# Patient Record
Sex: Female | Born: 1965 | Race: White | Hispanic: No | Smoking: Never smoker
Health system: Southern US, Community
[De-identification: ages and names within clinical notes are randomized; demographics above are authoritative.]

## PROBLEM LIST (undated history)

## (undated) DIAGNOSIS — E559 Vitamin D deficiency, unspecified: Secondary | ICD-10-CM

## (undated) DIAGNOSIS — E119 Type 2 diabetes mellitus without complications: Secondary | ICD-10-CM

## (undated) DIAGNOSIS — I10 Essential (primary) hypertension: Secondary | ICD-10-CM

---

## 2017-06-08 ENCOUNTER — Emergency Department (HOSPITAL_COMMUNITY)
Admission: EM | Admit: 2017-06-08 | Discharge: 2017-06-09 | Disposition: A | Discharge status: Home / Self Care | Payer: BLUE CROSS/BLUE SHIELD | Attending: Physician Assistant | Admitting: Physician Assistant

## 2017-06-08 ENCOUNTER — Encounter (HOSPITAL_COMMUNITY): Payer: Self-pay

## 2017-06-08 ENCOUNTER — Emergency Department (HOSPITAL_COMMUNITY): Payer: BLUE CROSS/BLUE SHIELD

## 2017-06-08 DIAGNOSIS — E119 Type 2 diabetes mellitus without complications: Secondary | ICD-10-CM | POA: Diagnosis not present

## 2017-06-08 DIAGNOSIS — M25511 Pain in right shoulder: Secondary | ICD-10-CM | POA: Insufficient documentation

## 2017-06-08 DIAGNOSIS — Z794 Long term (current) use of insulin: Secondary | ICD-10-CM | POA: Diagnosis not present

## 2017-06-08 DIAGNOSIS — K8020 Calculus of gallbladder without cholecystitis without obstruction: Secondary | ICD-10-CM

## 2017-06-08 DIAGNOSIS — R1013 Epigastric pain: Secondary | ICD-10-CM | POA: Diagnosis present

## 2017-06-08 DIAGNOSIS — R1011 Right upper quadrant pain: Secondary | ICD-10-CM

## 2017-06-08 DIAGNOSIS — I10 Essential (primary) hypertension: Secondary | ICD-10-CM | POA: Insufficient documentation

## 2017-06-08 HISTORY — DX: Vitamin D deficiency, unspecified: E55.9

## 2017-06-08 HISTORY — DX: Type 2 diabetes mellitus without complications: E11.9

## 2017-06-08 HISTORY — DX: Essential (primary) hypertension: I10

## 2017-06-08 LAB — URINALYSIS, ROUTINE W REFLEX MICROSCOPIC
BILIRUBIN URINE: NEGATIVE
Glucose, UA: NEGATIVE mg/dL
HGB URINE DIPSTICK: NEGATIVE
Ketones, ur: NEGATIVE mg/dL
Leukocytes, UA: NEGATIVE
NITRITE: NEGATIVE
PH: 5 (ref 5.0–8.0)
Protein, ur: NEGATIVE mg/dL
SPECIFIC GRAVITY, URINE: 1.017 (ref 1.005–1.030)

## 2017-06-08 LAB — COMPREHENSIVE METABOLIC PANEL
ALBUMIN: 4.1 g/dL (ref 3.5–5.0)
ALK PHOS: 84 U/L (ref 38–126)
ALT: 18 U/L (ref 14–54)
ANION GAP: 9 (ref 5–15)
AST: 22 U/L (ref 15–41)
BILIRUBIN TOTAL: 0.4 mg/dL (ref 0.3–1.2)
BUN: 18 mg/dL (ref 6–20)
CALCIUM: 9.8 mg/dL (ref 8.9–10.3)
CO2: 25 mmol/L (ref 22–32)
Chloride: 100 mmol/L — ABNORMAL LOW (ref 101–111)
Creatinine, Ser: 1.03 mg/dL — ABNORMAL HIGH (ref 0.44–1.00)
GFR calc Af Amer: >60 mL/min (ref >60)
GFR calc non Af Amer: >60 mL/min (ref >60)
GLUCOSE: 227 mg/dL — AB (ref 65–99)
Potassium: 4.3 mmol/L (ref 3.5–5.1)
Sodium: 134 mmol/L — ABNORMAL LOW (ref 135–145)
TOTAL PROTEIN: 7.2 g/dL (ref 6.5–8.1)

## 2017-06-08 LAB — CBC
HCT: 41.1 % (ref 36.0–46.0)
Hemoglobin: 14 g/dL (ref 12.0–15.0)
MCH: 29.4 pg (ref 26.0–34.0)
MCHC: 34.1 g/dL (ref 30.0–36.0)
MCV: 86.2 fL (ref 78.0–100.0)
PLATELETS: 357 10*3/uL (ref 150–400)
RBC: 4.77 MIL/uL (ref 3.87–5.11)
RDW: 12.4 % (ref 11.5–15.5)
WBC: 11.4 10*3/uL — ABNORMAL HIGH (ref 4.0–10.5)

## 2017-06-08 LAB — I-STAT TROPONIN, ED: TROPONIN I, POC: 0 ng/mL (ref 0.00–0.08)

## 2017-06-08 LAB — LIPASE, BLOOD: Lipase: 24 U/L (ref 11–51)

## 2017-06-08 MED ORDER — HYDROCODONE-ACETAMINOPHEN 5-325 MG PO TABS: 1.0000 | ORAL_TABLET | Freq: Four times a day (QID) | ORAL | 0 refills | Status: DC | PRN

## 2017-06-08 MED ORDER — SODIUM CHLORIDE 0.9 % IV BOLUS (SEPSIS)
1000.0000 mL | Freq: Once | INTRAVENOUS | Status: AC
  Administered 2017-06-08: 1000 mL via INTRAVENOUS

## 2017-06-08 MED ORDER — ONDANSETRON HCL 4 MG/2ML IJ SOLN
4.0000 mg | Freq: Once | INTRAMUSCULAR | Status: AC
  Administered 2017-06-08: 4 mg via INTRAVENOUS
  Filled 2017-06-08: qty 2

## 2017-06-08 MED ORDER — ONDANSETRON 4 MG PO TBDP: ORAL_TABLET | ORAL | 0 refills | Status: DC

## 2017-06-08 NOTE — ED Triage Notes (Signed)
Onset 2 weeks upper mid to right abd pain, tender to touch and nausea.

## 2017-06-08 NOTE — ED Notes (Signed)
Patient transported to Ultrasound 

## 2017-06-08 NOTE — Discharge Instructions (Signed)
1. Medications: zofran, vicodin, usual home medications 2. Treatment: rest, drink plenty of fluids, advance diet slowly 3. Follow Up: Please followup with your primary doctor in 2 days for discussion of your diagnoses and further evaluation after today's visit; if you do not have a primary care doctor use the resource guide provided to find one; Please return to the ER for persistent vomiting, high fevers, worsening pain or worsening symptoms

## 2017-06-08 NOTE — ED Provider Notes (Signed)
[facility] Provider Note   CSN: [id number] Arrival date & time: 06/08/17  1612     History   Chief Complaint Chief Complaint  Patient presents with  . Abdominal Pain    HPI Cynthia SparkMichelle Dykman is a 51 y.o. female with a hx of IDDM, HTN presents to the Emergency Department complaining of gradual, persistent, intermittent progressively worsening epigastric abd pain onset 2 weeks ago which became constant 1 week ago.  Pt reports the pain is sharp and burning rated at a 6/10.   Pt reports surgical hx of c-section in 1987.  Patient reports that the pain at times radiates into her right chest and right shoulder.  She denies chest pain or dyspnea on exertion.  She reports symptoms worsen after eating.  She has had associated nausea without vomiting.  Patient denies fevers or chills, vaginal discharge, dysuria.  No alleviating factors.  Also with complaints of hyperglycemia.  She reports she has been using insulin for approx 1 year, but reports her CBG has been difficult to control.  She reports her initial A1c was 12.8 at diagnosis and her blood sugar usually runs 200-500.  She has associated pain in her feet, polyuria and polydipsia.  She reports she has had some weight loss in the last several months but she has been attempting to change her diet.   The history is provided by the patient and medical records. No language interpreter was used.    Past Medical History:  Diagnosis Date  . Diabetes mellitus without complication (HCC)   . Hypertension   . Vitamin D deficiency     There are no active problems to display for this patient.   History reviewed. No pertinent surgical history.  OB History    No data available       Home Medications    Prior to Admission medications   Medication Sig Start Date End Date Taking? Authorizing Provider  HYDROcodone-acetaminophen (NORCO/VICODIN) 5-325 MG tablet Take 1-2 tablets every 6 (six) hours as needed  by mouth for moderate pain or severe pain. 06/08/17   Shavonte Zhao, Dahlia ClientHannah, PA-C  ondansetron (ZOFRAN ODT) 4 MG disintegrating tablet 4mg  ODT q4 hours prn nausea/vomit 06/08/17   Blanton Kardell, Dahlia ClientHannah, PA-C    Family History History reviewed. No pertinent family history.  Social History Social History   Tobacco Use  . Smoking status: Never Smoker  . Smokeless tobacco: Never Used  Substance Use Topics  . Alcohol use: Yes    Comment: occ   . Drug use: No     Allergies   Patient has no known allergies.   Review of Systems Review of Systems  Constitutional: Negative for appetite change, diaphoresis, fatigue, fever and unexpected weight change.  HENT: Negative for mouth sores.   Eyes: Negative for visual disturbance.  Respiratory: Negative for cough, chest tightness, shortness of breath and wheezing.   Cardiovascular: Positive for chest pain.  Gastrointestinal: Positive for abdominal pain and nausea. Negative for constipation, diarrhea and vomiting.  Endocrine: Positive for polydipsia and polyuria. Negative for polyphagia.  Genitourinary: Negative for dysuria, frequency, hematuria and urgency.  Musculoskeletal: Positive for arthralgias ( Shoulder). Negative for back pain and neck stiffness.  Skin: Negative for rash.  Allergic/Immunologic: Negative for immunocompromised state.  Neurological: Negative for syncope, light-headedness and headaches.  Hematological: Does not bruise/bleed easily.  Psychiatric/Behavioral: Negative for sleep disturbance. The patient is not nervous/anxious.   All other systems reviewed and are negative.    Physical Exam Updated Vital  Signs BP (!) 146/77   Pulse 68   Temp 98.7 F (37.1 C) (Oral)   Resp 18   Ht 5\' 3"  (1.6 m)   Wt 70.3 kg (155 lb)   SpO2 99%   BMI 27.46 kg/m   Physical Exam  Constitutional: She appears well-developed and well-nourished. No distress.  Awake, alert, nontoxic appearance  HENT:  Head: Normocephalic and  atraumatic.  Mouth/Throat: Oropharynx is clear and moist. No oropharyngeal exudate.  Eyes: Conjunctivae are normal. No scleral icterus.  Neck: Normal range of motion. Neck supple.  Cardiovascular: Normal rate, regular rhythm and intact distal pulses.  Pulmonary/Chest: Effort normal and breath sounds normal. No respiratory distress. She has no wheezes.  Equal chest expansion  Abdominal: Soft. Bowel sounds are normal. She exhibits no mass. There is no hepatomegaly. There is tenderness in the right upper quadrant. There is guarding and positive Murphy's sign. There is no rigidity, no rebound, no CVA tenderness and no tenderness at McBurney's point. No hernia.  Musculoskeletal: Normal range of motion. She exhibits no edema.  Neurological: She is alert.  Speech is clear and goal oriented Moves extremities without ataxia  Skin: Skin is warm and dry. She is not diaphoretic.  Psychiatric: She has a normal mood and affect.  Nursing note and vitals reviewed.    ED Treatments / Results  Labs (all labs ordered are listed, but only abnormal results are displayed) Labs Reviewed  COMPREHENSIVE METABOLIC PANEL - Abnormal; Notable for the following components:      Result Value   Sodium 134 (*)    Chloride 100 (*)    Glucose, Bld 227 (*)    Creatinine, Ser 1.03 (*)    All other components within normal limits  CBC - Abnormal; Notable for the following components:   WBC 11.4 (*)    All other components within normal limits  LIPASE, BLOOD  URINALYSIS, ROUTINE W REFLEX MICROSCOPIC  I-STAT TROPONIN, ED    EKG  EKG Interpretation  Date/Time:  Sunday June 08 2017 21:00:38 EST Ventricular Rate:  66 PR Interval:    QRS Duration: 94 QT Interval:  418 QTC Calculation: 438 R Axis:   78 Text Interpretation:  Sinus rhythm Consider left atrial enlargement Normal sinus rhythm Confirmed by Corlis Leak, Courteney ([id number]) on 06/08/2017 9:05:27 PM       Radiology US Abdomen Complete  Result Date:  06/08/2017 CLINICAL DATA:  51 y/o F; paraumbilical and right upper quadrant abdominal pain for 2 weeks. History of C-section, diabetes, and hypertension. EXAM: ABDOMEN ULTRASOUND COMPLETE COMPARISON:  None. FINDINGS: Gallbladder: Gallstones. No gallbladder wall thickening or pericholecystic fluid. Negative sonographic Murphy sign. Common bile duct: Diameter: 3.4 mm Liver: No focal lesion identified. Increased liver echogenicity. Portal vein is patent on color Doppler imaging with normal direction of blood flow towards the liver. IVC: No abnormality visualized. Pancreas: Visualized portion unremarkable. Spleen: 12.9 cm spleen. Right Kidney: Length: 12.1 cm. Echogenicity within normal limits. No mass or hydronephrosis visualized. Left Kidney: Length: 12.9 cm. Echogenicity within normal limits. No mass or hydronephrosis visualized. Abdominal aorta: No aneurysm visualized. Other findings: None. IMPRESSION: 1. Cholelithiasis.  No secondary signs of acute cholecystitis. 2. Hepatic steatosis. 3. Otherwise unremarkable abdominal ultrasound. Electronically Signed   By: Mitzi Hansen M.D.   On: 06/08/2017 22:57    Procedures Procedures (including critical care time)  Medications Ordered in ED Medications  sodium chloride 0.9 % bolus 1,000 mL (1,000 mLs Intravenous New Bag/Given 06/08/17 2114)  ondansetron (ZOFRAN) injection 4 mg (4  mg Intravenous Given 06/08/17 2112)     Initial Impression / Assessment and Plan / ED Course  I have reviewed the triage vital signs and the nursing notes.  Pertinent labs & imaging results that were available during my care of the patient were reviewed by me and considered in my medical decision making (see chart for details).     Right upper quadrant abdominal pain.  No emesis.  Moderate right upper quadrant tenderness on exam.  She declines pain medicine.  She has been able to eat and drink prior to her arrival in the emergency department without severe pain or  vomiting.  Repeat exam is soft.  Pt continues to climb pain control.  Discussed results and recommended follow-up with general surgery.  Also discussed reasons to return to the emergency department.  Patient states understanding and is in agreement with the plan.  Final Clinical Impressions(s) / ED Diagnoses   Final diagnoses:  Right upper quadrant abdominal pain  Calculus of gallbladder without cholecystitis without obstruction    ED Discharge Orders        Ordered    ondansetron (ZOFRAN ODT) 4 MG disintegrating tablet     06/08/17 2314    HYDROcodone-acetaminophen (NORCO/VICODIN) 5-325 MG tablet  Every 6 hours PRN     06/08/17 2314       Marlyn Rabine, Boyd KerbsHannah, PA-C 06/08/17 2353    Mackuen, Cindee Saltourteney Lyn, MD 06/12/17 2313

## 2017-06-08 NOTE — ED Notes (Signed)
ED Provider at bedside. 

## 2019-11-10 NOTE — Unmapped External Note (Signed)
 30 Day Event Monitor applied per Leotis Sella, NP for palpitations.   Electronically signed by: Nathanel VEAR Fraction, CMA 11/10/19 1333

## 2020-06-12 ENCOUNTER — Inpatient Hospital Stay (HOSPITAL_COMMUNITY)
Admission: AD | Admit: 2020-06-12 | Discharge: 2020-06-20 | DRG: 234 | Disposition: A | Discharge status: Home Health | Payer: BC Managed Care – PPO | Source: Ambulatory Visit | Attending: Cardiothoracic Surgery | Admitting: Cardiothoracic Surgery

## 2020-06-12 DIAGNOSIS — I25110 Atherosclerotic heart disease of native coronary artery with unstable angina pectoris: Principal | ICD-10-CM | POA: Diagnosis present

## 2020-06-12 DIAGNOSIS — I249 Acute ischemic heart disease, unspecified: Secondary | ICD-10-CM | POA: Diagnosis present

## 2020-06-12 DIAGNOSIS — I2510 Atherosclerotic heart disease of native coronary artery without angina pectoris: Secondary | ICD-10-CM

## 2020-06-12 DIAGNOSIS — I10 Essential (primary) hypertension: Secondary | ICD-10-CM | POA: Diagnosis present

## 2020-06-12 DIAGNOSIS — Z955 Presence of coronary angioplasty implant and graft: Secondary | ICD-10-CM

## 2020-06-12 DIAGNOSIS — R072 Precordial pain: Secondary | ICD-10-CM | POA: Diagnosis present

## 2020-06-12 DIAGNOSIS — J9811 Atelectasis: Secondary | ICD-10-CM | POA: Diagnosis not present

## 2020-06-12 DIAGNOSIS — Z9889 Other specified postprocedural states: Secondary | ICD-10-CM

## 2020-06-12 DIAGNOSIS — I6523 Occlusion and stenosis of bilateral carotid arteries: Secondary | ICD-10-CM | POA: Diagnosis present

## 2020-06-12 DIAGNOSIS — E785 Hyperlipidemia, unspecified: Secondary | ICD-10-CM | POA: Diagnosis present

## 2020-06-12 DIAGNOSIS — J939 Pneumothorax, unspecified: Secondary | ICD-10-CM

## 2020-06-12 DIAGNOSIS — Z951 Presence of aortocoronary bypass graft: Secondary | ICD-10-CM

## 2020-06-12 DIAGNOSIS — E1165 Type 2 diabetes mellitus with hyperglycemia: Secondary | ICD-10-CM | POA: Diagnosis present

## 2020-06-12 DIAGNOSIS — D62 Acute posthemorrhagic anemia: Secondary | ICD-10-CM | POA: Diagnosis not present

## 2020-06-12 DIAGNOSIS — Z20822 Contact with and (suspected) exposure to covid-19: Secondary | ICD-10-CM | POA: Diagnosis present

## 2020-06-12 LAB — CBC WITH DIFFERENTIAL/PLATELET
Abs Immature Granulocytes: 0.02 10*3/uL (ref 0.00–0.07)
Basophils Absolute: 0.1 10*3/uL (ref 0.0–0.1)
Basophils Relative: 1 %
Eosinophils Absolute: 0 10*3/uL (ref 0.0–0.5)
Eosinophils Relative: 1 %
HCT: 37.8 % (ref 36.0–46.0)
Hemoglobin: 12.9 g/dL (ref 12.0–15.0)
Immature Granulocytes: 0 %
Lymphocytes Relative: 32 %
Lymphs Abs: 2.6 10*3/uL (ref 0.7–4.0)
MCH: 29.7 pg (ref 26.0–34.0)
MCHC: 34.1 g/dL (ref 30.0–36.0)
MCV: 87.1 fL (ref 80.0–100.0)
Monocytes Absolute: 0.4 10*3/uL (ref 0.1–1.0)
Monocytes Relative: 5 %
Neutro Abs: 5.1 10*3/uL (ref 1.7–7.7)
Neutrophils Relative %: 61 %
Platelets: 313 10*3/uL (ref 150–400)
RBC: 4.34 MIL/uL (ref 3.87–5.11)
RDW: 13.1 % (ref 11.5–15.5)
WBC: 8.2 10*3/uL (ref 4.0–10.5)
nRBC: 0 % (ref 0.0–0.2)

## 2020-06-12 LAB — COMPREHENSIVE METABOLIC PANEL
ALT: 32 U/L (ref 0–44)
AST: 30 U/L (ref 15–41)
Albumin: 3.8 g/dL (ref 3.5–5.0)
Alkaline Phosphatase: 61 U/L (ref 38–126)
Anion gap: 14 (ref 5–15)
BUN: 14 mg/dL (ref 6–20)
CO2: 24 mmol/L (ref 22–32)
Calcium: 9.5 mg/dL (ref 8.9–10.3)
Chloride: 100 mmol/L (ref 98–111)
Creatinine, Ser: 1 mg/dL (ref 0.44–1.00)
GFR, Estimated: >60 mL/min (ref >60)
Glucose, Bld: 286 mg/dL — ABNORMAL HIGH (ref 70–99)
Potassium: 3.8 mmol/L (ref 3.5–5.1)
Sodium: 138 mmol/L (ref 135–145)
Total Bilirubin: 0.9 mg/dL (ref 0.3–1.2)
Total Protein: 6.7 g/dL (ref 6.5–8.1)

## 2020-06-12 LAB — PROTIME-INR
INR: 1 (ref 0.8–1.2)
Prothrombin Time: 12.9 seconds (ref 11.4–15.2)

## 2020-06-12 LAB — HEMOGLOBIN A1C
Hgb A1c MFr Bld: 9.5 % — ABNORMAL HIGH (ref 4.8–5.6)
Mean Plasma Glucose: 225.95 mg/dL

## 2020-06-12 LAB — HEPARIN LEVEL (UNFRACTIONATED): Heparin Unfractionated: 0.52 IU/mL (ref 0.30–0.70)

## 2020-06-12 LAB — HIV ANTIBODY (ROUTINE TESTING W REFLEX): HIV Screen 4th Generation wRfx: NONREACTIVE

## 2020-06-12 LAB — RESPIRATORY PANEL BY RT PCR (FLU A&B, COVID)
Influenza A by PCR: NEGATIVE
Influenza B by PCR: NEGATIVE
SARS Coronavirus 2 by RT PCR: NEGATIVE

## 2020-06-12 LAB — GLUCOSE, CAPILLARY
Glucose-Capillary: 175 mg/dL — ABNORMAL HIGH (ref 70–99)
Glucose-Capillary: 126 mg/dL — ABNORMAL HIGH (ref 70–99)

## 2020-06-12 LAB — APTT: aPTT: 27 seconds (ref 24–36)

## 2020-06-12 LAB — TROPONIN I (HIGH SENSITIVITY)
Troponin I (High Sensitivity): 18 ng/L — ABNORMAL HIGH (ref <18)
Troponin I (High Sensitivity): 20 ng/L — ABNORMAL HIGH (ref <18)

## 2020-06-12 MED ORDER — INSULIN GLARGINE 100 UNIT/ML SOLOSTAR PEN: 20.0000 [IU] | PEN_INJECTOR | SUBCUTANEOUS | Status: DC

## 2020-06-12 MED ORDER — ACETAMINOPHEN 325 MG PO TABS
650.0000 mg | ORAL_TABLET | ORAL | Status: DC | PRN
  Administered 2020-06-12: 650 mg via ORAL
  Filled 2020-06-12: qty 2

## 2020-06-12 MED ORDER — SODIUM CHLORIDE 0.9% FLUSH: 3.0000 mL | INTRAVENOUS | Status: DC | PRN

## 2020-06-12 MED ORDER — SODIUM CHLORIDE 0.9 % IV SOLN: INTRAVENOUS | Status: DC

## 2020-06-12 MED ORDER — INSULIN ASPART 100 UNIT/ML ~~LOC~~ SOLN
4.0000 [IU] | Freq: Three times a day (TID) | SUBCUTANEOUS | Status: DC
  Administered 2020-06-13 (×2): 4 [IU] via SUBCUTANEOUS

## 2020-06-12 MED ORDER — INSULIN GLARGINE 100 UNIT/ML ~~LOC~~ SOLN
20.0000 [IU] | Freq: Two times a day (BID) | SUBCUTANEOUS | Status: DC
  Administered 2020-06-12 – 2020-06-13 (×2): 20 [IU] via SUBCUTANEOUS
  Filled 2020-06-12 (×4): qty 0.2

## 2020-06-12 MED ORDER — ASPIRIN EC 81 MG PO TBEC
81.0000 mg | DELAYED_RELEASE_TABLET | Freq: Every day | ORAL | Status: DC
  Administered 2020-06-13: 81 mg via ORAL
  Filled 2020-06-12: qty 1

## 2020-06-12 MED ORDER — ONDANSETRON HCL 4 MG/2ML IJ SOLN: 4.0000 mg | Freq: Four times a day (QID) | INTRAMUSCULAR | Status: DC | PRN

## 2020-06-12 MED ORDER — METOPROLOL SUCCINATE ER 25 MG PO TB24
25.0000 mg | ORAL_TABLET | Freq: Every day | ORAL | Status: DC
  Administered 2020-06-12: 25 mg via ORAL
  Filled 2020-06-12 (×2): qty 1

## 2020-06-12 MED ORDER — HEPARIN BOLUS VIA INFUSION
4000.0000 [IU] | Freq: Once | INTRAVENOUS | Status: AC
  Administered 2020-06-12: 4000 [IU] via INTRAVENOUS
  Filled 2020-06-12: qty 4000

## 2020-06-12 MED ORDER — ROSUVASTATIN CALCIUM 20 MG PO TABS
20.0000 mg | ORAL_TABLET | Freq: Every day | ORAL | Status: DC
  Administered 2020-06-12 – 2020-06-20 (×8): 20 mg via ORAL
  Filled 2020-06-12 (×8): qty 1

## 2020-06-12 MED ORDER — NITROGLYCERIN 0.4 MG SL SUBL: 0.4000 mg | SUBLINGUAL_TABLET | SUBLINGUAL | Status: DC | PRN

## 2020-06-12 MED ORDER — LISINOPRIL 10 MG PO TABS
10.0000 mg | ORAL_TABLET | Freq: Every day | ORAL | Status: DC
  Administered 2020-06-12 – 2020-06-13 (×2): 10 mg via ORAL
  Filled 2020-06-12 (×2): qty 1

## 2020-06-12 MED ORDER — SODIUM CHLORIDE 0.9 % IV SOLN: 250.0000 mL | INTRAVENOUS | Status: DC | PRN

## 2020-06-12 MED ORDER — HEPARIN (PORCINE) 25000 UT/250ML-% IV SOLN
1000.0000 [IU]/h | INTRAVENOUS | Status: DC
  Administered 2020-06-12: 1000 [IU]/h via INTRAVENOUS
  Filled 2020-06-12: qty 250

## 2020-06-12 MED ORDER — INSULIN ASPART 100 UNIT/ML ~~LOC~~ SOLN
0.0000 [IU] | Freq: Three times a day (TID) | SUBCUTANEOUS | Status: DC
  Administered 2020-06-13: 2 [IU] via SUBCUTANEOUS

## 2020-06-12 MED ORDER — SODIUM CHLORIDE 0.9% FLUSH
3.0000 mL | Freq: Two times a day (BID) | INTRAVENOUS | Status: DC
  Administered 2020-06-12: 3 mL via INTRAVENOUS

## 2020-06-12 NOTE — Progress Notes (Signed)
ANTICOAGULATION CONSULT NOTE - Initial Consult  Pharmacy Consult for Heparin Indication: chest pain/ACS  No Known Allergies  Patient Measurements: Height: 5\' 3"  (160 cm) Weight: 75.8 kg (167 lb) IBW/kg (Calculated) : 52.4 Heparin Dosing Weight:  68.6 kg  Vital Signs: Temp: 98.2 F (36.8 C) (11/22 1422) Temp Source: Oral (11/22 1422) BP: 122/72 (11/22 1422) Pulse Rate: 73 (11/22 1422)  Labs: No results for input(s): HGB, HCT, PLT, APTT, LABPROT, INR, HEPARINUNFRC, HEPRLOWMOCWT, CREATININE, CKTOTAL, CKMB, TROPONINIHS in the last 72 hours.  CrCl cannot be calculated (Patient's most recent lab result is older than the maximum 21 days allowed.).   Medical History: Past Medical History:  Diagnosis Date  . Diabetes mellitus without complication (HCC)   . Hypertension   . Vitamin D deficiency     Assessment: CC/HPI: CP  PMH: Known CAD with stent. HTN, HLD, DM, Vit D def.   Anticoag: Start IV heparin for CP. No PTA anticoagulation noted on pharmacy refill history.   Goal of Therapy:  Heparin level 0.3-0.7 units/ml Monitor platelets by anticoagulation protocol: Yes   Plan:  Baseline labs to be drawn. Heparin 4000 unit bolus Heparin infusion at 1000 units/hr Check heparin level in 6-8 hrs. Daily HL and CBC   Zlatan Hornback S. 01-28-2005, PharmD, BCPS Clinical Staff Pharmacist [email or web address] Merilynn Finland, Merilynn Finland 06/12/2020,2:53 PM

## 2020-06-12 NOTE — Progress Notes (Signed)
ANTICOAGULATION CONSULT NOTE   Pharmacy Consult for Heparin Indication: chest pain/ACS  Allergies  Allergen Reactions  . Darvon [Propoxyphene] Nausea And Vomiting and Other (See Comments)    Darvocet- "years ago"- "Made me VERY sick."  . Tape Rash and Other (See Comments)    "Tapes and Band-Aids blister my skin"    Patient Measurements: Height: 5\' 3"  (160 cm) Weight: 75.8 kg (167 lb) IBW/kg (Calculated) : 52.4 Heparin Dosing Weight:  68.6 kg  Vital Signs: Temp: 98.1 F (36.7 C) (11/22 2145) Temp Source: Oral (11/22 2145) BP: 124/73 (11/22 2145) Pulse Rate: 58 (11/22 2145)  Labs: Recent Labs    06/12/20 1456 06/12/20 1725 06/12/20 2245  HGB 12.9  --   --   HCT 37.8  --   --   PLT 313  --   --   APTT 27  --   --   LABPROT 12.9  --   --   INR 1.0  --   --   HEPARINUNFRC  --   --  0.52  CREATININE 1.00  --   --   TROPONINIHS 18* 20*  --     Estimated Creatinine Clearance: 61.3 mL/min (by C-G formula based on SCr of 1 mg/dL).   Medical History: Past Medical History:  Diagnosis Date  . Diabetes mellitus without complication (HCC)   . Hypertension   . Vitamin D deficiency     Assessment: CC/HPI: CP  PMH: Known CAD with stent. HTN, HLD, DM, Vit D def.   Anticoag: Start IV heparin for CP. No PTA anticoagulation noted on pharmacy refill history.  11/22 PM update:  Initial heparin level therapeutic  CBC good  Goal of Therapy:  Heparin level 0.3-0.7 units/ml Monitor platelets by anticoagulation protocol: Yes   Plan:  Cont heparin 1000 units/hr Confirmatory heparin level with AM labs  12/22, PharmD, BCPS Clinical Pharmacist Phone: [phone]

## 2020-06-12 NOTE — H&P (Signed)
Referring Physician: Richardo Hanks, MD  Cynthia Hale is an 54 y.o. female.                       Chief Complaint: Chest pain  HPI: 53 years old white female with known h/o CAD with LCx stent about 1.5 years ago, hypertension, hyperlipidemia, type 2 DM and hyperglycemia had chest pain, retrosternal, radiating to back and relieved by SL NTG. She had TMST for pre-op evaluation. She had submaximal stress test and had 2 mm ST depression in inferior and lateral leads with shortness of breath. She has hyperglycemia.  Past Medical History:  Diagnosis Date  . Diabetes mellitus without complication (HCC)   . Hypertension   . Vitamin D deficiency       No past surgical history on file.  No family history on file. Social History:  reports that she has never smoked. She has never used smokeless tobacco. She reports current alcohol use. She reports that she does not use drugs.  Allergies: No Known Allergies  Medications Prior to Admission  Medication Sig Dispense Refill  . HYDROcodone-acetaminophen (NORCO/VICODIN) 5-325 MG tablet Take 1-2 tablets every 6 (six) hours as needed by mouth for moderate pain or severe pain. 7 tablet 0  . ondansetron (ZOFRAN ODT) 4 MG disintegrating tablet 4mg  ODT q4 hours prn nausea/vomit 4 tablet 0    No results found for this or any previous visit (from the past 48 hour(s)). No results found.  Review Of Systems Constitutional: No fever, chills, weight loss or gain. Eyes: No vision change, wears glasses. No discharge or pain. Ears: No hearing loss, No tinnitus. Respiratory: No asthma, COPD, pneumonias. Positive shortness of breath. No hemoptysis. Cardiovascular: Positive chest pain, palpitation, no leg edema. Gastrointestinal: No nausea, vomiting, diarrhea, constipation. No GI bleed. No hepatitis. Genitourinary: No dysuria, hematuria, kidney stone. No incontinance. Neurological: No headache, stroke, seizures.  Psychiatry: No psych facility admission for anxiety,  depression, suicide. No detox. Skin: No rash. Musculoskeletal: Positive joint pain, fibromyalgia. No neck pain, back pain. Lymphadenopathy: No lymphadenopathy. Hematology: No anemia or easy bruising.   Blood pressure 122/72, pulse 73, temperature 98.2 F (36.8 C), temperature source Oral, resp. rate 16, height 5\' 3"  (1.6 m), weight 75.8 kg, SpO2 100 %. Body mass index is 29.58 kg/m. General appearance: alert, cooperative, appears stated age and no distress Head: Normocephalic, atraumatic. Eyes: Blue eyes, pink conjunctiva, corneas clear. PERRL, EOM's intact. Neck: No adenopathy, no carotid bruit, no JVD, supple, symmetrical, trachea midline and thyroid not enlarged. Resp: Clear to auscultation bilaterally. Cardio: Regular rate and rhythm, S1, S2 normal, II/VI systolic murmur, no click, rub or gallop GI: Soft, non-tender; bowel sounds normal; no organomegaly. Extremities: No edema, cyanosis or clubbing. Skin: Warm and dry.  Neurologic: Alert and oriented X 3, normal strength. Normal coordination and gait.  Assessment/Plan Acute coronary syndrome Hypertension Type 2 DM Hyperlipidemia S/P LCx stent ( 08/2018 )  Place in observation. IV fluids. IV heparin. Home medications. Cardiac cath in AM.  Time spent: Review of old records, Lab, x-rays, EKG, other cardiac tests, examination, discussion with patient over 70 minutes.  , MD  06/12/2020, 2:43 PM

## 2020-06-13 ENCOUNTER — Other Ambulatory Visit: Payer: Self-pay | Admitting: *Deleted

## 2020-06-13 ENCOUNTER — Encounter (HOSPITAL_COMMUNITY): Payer: Self-pay | Admitting: Cardiovascular Disease

## 2020-06-13 ENCOUNTER — Inpatient Hospital Stay (HOSPITAL_COMMUNITY): Payer: BC Managed Care – PPO

## 2020-06-13 ENCOUNTER — Encounter (HOSPITAL_COMMUNITY)
Admission: AD | Disposition: A | Discharge status: Home Health | Payer: Self-pay | Source: Ambulatory Visit | Attending: Cardiothoracic Surgery

## 2020-06-13 ENCOUNTER — Observation Stay (HOSPITAL_COMMUNITY): Payer: BC Managed Care – PPO

## 2020-06-13 DIAGNOSIS — Z20822 Contact with and (suspected) exposure to covid-19: Secondary | ICD-10-CM | POA: Diagnosis present

## 2020-06-13 DIAGNOSIS — I10 Essential (primary) hypertension: Secondary | ICD-10-CM | POA: Diagnosis present

## 2020-06-13 DIAGNOSIS — J9811 Atelectasis: Secondary | ICD-10-CM | POA: Diagnosis not present

## 2020-06-13 DIAGNOSIS — Z01810 Encounter for preprocedural cardiovascular examination: Secondary | ICD-10-CM | POA: Diagnosis not present

## 2020-06-13 DIAGNOSIS — D62 Acute posthemorrhagic anemia: Secondary | ICD-10-CM | POA: Diagnosis not present

## 2020-06-13 DIAGNOSIS — I25110 Atherosclerotic heart disease of native coronary artery with unstable angina pectoris: Secondary | ICD-10-CM | POA: Diagnosis present

## 2020-06-13 DIAGNOSIS — E1165 Type 2 diabetes mellitus with hyperglycemia: Secondary | ICD-10-CM | POA: Diagnosis present

## 2020-06-13 DIAGNOSIS — E785 Hyperlipidemia, unspecified: Secondary | ICD-10-CM | POA: Diagnosis present

## 2020-06-13 DIAGNOSIS — Z955 Presence of coronary angioplasty implant and graft: Secondary | ICD-10-CM | POA: Diagnosis not present

## 2020-06-13 DIAGNOSIS — I6523 Occlusion and stenosis of bilateral carotid arteries: Secondary | ICD-10-CM | POA: Diagnosis present

## 2020-06-13 DIAGNOSIS — I2510 Atherosclerotic heart disease of native coronary artery without angina pectoris: Secondary | ICD-10-CM | POA: Diagnosis not present

## 2020-06-13 DIAGNOSIS — I6521 Occlusion and stenosis of right carotid artery: Secondary | ICD-10-CM

## 2020-06-13 HISTORY — PX: LEFT HEART CATH AND CORONARY ANGIOGRAPHY: CATH118249

## 2020-06-13 LAB — URINALYSIS, ROUTINE W REFLEX MICROSCOPIC
Bilirubin Urine: NEGATIVE
Glucose, UA: NEGATIVE mg/dL
Hgb urine dipstick: NEGATIVE
Ketones, ur: NEGATIVE mg/dL
Leukocytes,Ua: NEGATIVE
Nitrite: NEGATIVE
Protein, ur: NEGATIVE mg/dL
Specific Gravity, Urine: 1.004 — ABNORMAL LOW (ref 1.005–1.030)
pH: 6 (ref 5.0–8.0)

## 2020-06-13 LAB — PULMONARY FUNCTION TEST
FEF 25-75 Pre: 1.88 L/sec
FEF2575-%Pred-Pre: 76 %
FEV1-%Pred-Pre: 87 %
FEV1-Pre: 2.26 L
FEV1FVC-%Pred-Pre: 98 %
FEV6-%Pred-Pre: 91 %
FEV6-Pre: 2.91 L
FEV6FVC-%Pred-Pre: 103 %
FVC-%Pred-Pre: 88 %
FVC-Pre: 2.91 L
Pre FEV1/FVC ratio: 77 %
Pre FEV6/FVC Ratio: 100 %

## 2020-06-13 LAB — BLOOD GAS, ARTERIAL
Acid-Base Excess: 0 mmol/L (ref 0.0–2.0)
Bicarbonate: 23.5 mmol/L (ref 20.0–28.0)
Drawn by: 59156
FIO2: 21
O2 Saturation: 98.4 %
Patient temperature: 36.6
pCO2 arterial: 33.7 mmHg (ref 32.0–48.0)
pH, Arterial: 7.457 — ABNORMAL HIGH (ref 7.350–7.450)
pO2, Arterial: 104 mmHg (ref 83.0–108.0)

## 2020-06-13 LAB — BASIC METABOLIC PANEL
Anion gap: 11 (ref 5–15)
BUN: 14 mg/dL (ref 6–20)
CO2: 25 mmol/L (ref 22–32)
Calcium: 9.1 mg/dL (ref 8.9–10.3)
Chloride: 102 mmol/L (ref 98–111)
Creatinine, Ser: 0.94 mg/dL (ref 0.44–1.00)
GFR, Estimated: >60 mL/min (ref >60)
Glucose, Bld: 150 mg/dL — ABNORMAL HIGH (ref 70–99)
Potassium: 4 mmol/L (ref 3.5–5.1)
Sodium: 138 mmol/L (ref 135–145)

## 2020-06-13 LAB — GLUCOSE, CAPILLARY
Glucose-Capillary: 146 mg/dL — ABNORMAL HIGH (ref 70–99)
Glucose-Capillary: 127 mg/dL — ABNORMAL HIGH (ref 70–99)
Glucose-Capillary: 103 mg/dL — ABNORMAL HIGH (ref 70–99)
Glucose-Capillary: 131 mg/dL — ABNORMAL HIGH (ref 70–99)

## 2020-06-13 LAB — ECHOCARDIOGRAM COMPLETE
Area-P 1/2: 3.65 cm2
Height: 63 in
S' Lateral: 2.4 cm
Weight: 2656.1 oz

## 2020-06-13 LAB — CBC
HCT: 37.9 % (ref 36.0–46.0)
Hemoglobin: 12.6 g/dL (ref 12.0–15.0)
MCH: 29.6 pg (ref 26.0–34.0)
MCHC: 33.2 g/dL (ref 30.0–36.0)
MCV: 89.2 fL (ref 80.0–100.0)
Platelets: 310 10*3/uL (ref 150–400)
RBC: 4.25 MIL/uL (ref 3.87–5.11)
RDW: 13.1 % (ref 11.5–15.5)
WBC: 8.7 10*3/uL (ref 4.0–10.5)
nRBC: 0 % (ref 0.0–0.2)

## 2020-06-13 LAB — TYPE AND SCREEN
ABO/RH(D): B POS
Antibody Screen: NEGATIVE

## 2020-06-13 LAB — LIPID PANEL
Cholesterol: 187 mg/dL (ref 0–200)
HDL: 38 mg/dL — ABNORMAL LOW (ref >40)
LDL Cholesterol: 105 mg/dL — ABNORMAL HIGH (ref 0–99)
Total CHOL/HDL Ratio: 4.9 RATIO
Triglycerides: 218 mg/dL — ABNORMAL HIGH (ref <150)
VLDL: 44 mg/dL — ABNORMAL HIGH (ref 0–40)

## 2020-06-13 LAB — SURGICAL PCR SCREEN
MRSA, PCR: NEGATIVE
Staphylococcus aureus: NEGATIVE

## 2020-06-13 LAB — ABO/RH: ABO/RH(D): B POS

## 2020-06-13 LAB — HEPARIN LEVEL (UNFRACTIONATED): Heparin Unfractionated: 0.38 IU/mL (ref 0.30–0.70)

## 2020-06-13 SURGERY — LEFT HEART CATH AND CORONARY ANGIOGRAPHY
Anesthesia: LOCAL

## 2020-06-13 MED ORDER — METOPROLOL TARTRATE 12.5 MG HALF TABLET
12.5000 mg | ORAL_TABLET | Freq: Once | ORAL | Status: AC
  Administered 2020-06-14: 12.5 mg via ORAL
  Filled 2020-06-13: qty 1

## 2020-06-13 MED ORDER — HEPARIN (PORCINE) 25000 UT/250ML-% IV SOLN
1000.0000 [IU]/h | INTRAVENOUS | Status: DC
  Administered 2020-06-13: 1000 [IU]/h via INTRAVENOUS
  Filled 2020-06-13: qty 250

## 2020-06-13 MED ORDER — SODIUM CHLORIDE 0.9 % IV SOLN
750.0000 mg | INTRAVENOUS | Status: AC
  Administered 2020-06-14: 750 mg via INTRAVENOUS
  Filled 2020-06-13: qty 750

## 2020-06-13 MED ORDER — SODIUM CHLORIDE 0.9 % IV SOLN
INTRAVENOUS | Status: DC
  Filled 2020-06-13: qty 30

## 2020-06-13 MED ORDER — MIDAZOLAM HCL 2 MG/2ML IJ SOLN
INTRAMUSCULAR | Status: AC
  Filled 2020-06-13: qty 2

## 2020-06-13 MED ORDER — TEMAZEPAM 15 MG PO CAPS
15.0000 mg | ORAL_CAPSULE | Freq: Once | ORAL | Status: AC | PRN
  Administered 2020-06-13: 15 mg via ORAL
  Filled 2020-06-13: qty 1

## 2020-06-13 MED ORDER — IOHEXOL 350 MG/ML SOLN
INTRAVENOUS | Status: DC | PRN
  Administered 2020-06-13: 70 mL

## 2020-06-13 MED ORDER — HYDRALAZINE HCL 20 MG/ML IJ SOLN: 10.0000 mg | INTRAMUSCULAR | Status: AC | PRN

## 2020-06-13 MED ORDER — BISACODYL 5 MG PO TBEC
5.0000 mg | DELAYED_RELEASE_TABLET | Freq: Once | ORAL | Status: AC
  Administered 2020-06-13: 5 mg via ORAL
  Filled 2020-06-13: qty 1

## 2020-06-13 MED ORDER — VANCOMYCIN HCL 1250 MG/250ML IV SOLN
1250.0000 mg | INTRAVENOUS | Status: AC
  Administered 2020-06-14: 1250 mg via INTRAVENOUS
  Filled 2020-06-13: qty 250

## 2020-06-13 MED ORDER — LABETALOL HCL 5 MG/ML IV SOLN: 10.0000 mg | INTRAVENOUS | Status: AC | PRN

## 2020-06-13 MED ORDER — TRANEXAMIC ACID (OHS) PUMP PRIME SOLUTION
2.0000 mg/kg | INTRAVENOUS | Status: DC
  Filled 2020-06-13: qty 1.51

## 2020-06-13 MED ORDER — SODIUM CHLORIDE 0.9% FLUSH: 3.0000 mL | Freq: Two times a day (BID) | INTRAVENOUS | Status: DC

## 2020-06-13 MED ORDER — LIDOCAINE HCL (PF) 1 % IJ SOLN
INTRAMUSCULAR | Status: AC
  Filled 2020-06-13: qty 30

## 2020-06-13 MED ORDER — PHENYLEPHRINE HCL-NACL 20-0.9 MG/250ML-% IV SOLN
30.0000 ug/min | INTRAVENOUS | Status: DC
  Administered 2020-06-14: 40 ug/min via INTRAVENOUS
  Filled 2020-06-13: qty 250

## 2020-06-13 MED ORDER — EPINEPHRINE HCL 5 MG/250ML IV SOLN IN NS
0.0000 ug/min | INTRAVENOUS | Status: DC
  Filled 2020-06-13: qty 250

## 2020-06-13 MED ORDER — FENTANYL CITRATE (PF) 100 MCG/2ML IJ SOLN
INTRAMUSCULAR | Status: DC | PRN
  Administered 2020-06-13: 25 ug via INTRAVENOUS

## 2020-06-13 MED ORDER — POTASSIUM CHLORIDE 2 MEQ/ML IV SOLN
80.0000 meq | INTRAVENOUS | Status: DC
  Filled 2020-06-13: qty 40

## 2020-06-13 MED ORDER — NOREPINEPHRINE 4 MG/250ML-% IV SOLN
0.0000 ug/min | INTRAVENOUS | Status: DC
  Filled 2020-06-13: qty 250

## 2020-06-13 MED ORDER — SODIUM CHLORIDE 0.9% FLUSH: 3.0000 mL | INTRAVENOUS | Status: DC | PRN

## 2020-06-13 MED ORDER — INSULIN REGULAR(HUMAN) IN NACL 100-0.9 UT/100ML-% IV SOLN
INTRAVENOUS | Status: AC
  Administered 2020-06-14: 4.4 [IU]/h via INTRAVENOUS
  Filled 2020-06-13: qty 100

## 2020-06-13 MED ORDER — SODIUM CHLORIDE 0.9 % IV SOLN
1.5000 g | INTRAVENOUS | Status: AC
  Administered 2020-06-14: 1.5 g via INTRAVENOUS
  Filled 2020-06-13: qty 1.5

## 2020-06-13 MED ORDER — CHLORHEXIDINE GLUCONATE 0.12 % MT SOLN
15.0000 mL | Freq: Once | OROMUCOSAL | Status: AC
  Administered 2020-06-14: 15 mL via OROMUCOSAL
  Filled 2020-06-13: qty 15

## 2020-06-13 MED ORDER — LIDOCAINE HCL (PF) 1 % IJ SOLN
INTRAMUSCULAR | Status: DC | PRN
  Administered 2020-06-13: 20 mL via INTRADERMAL

## 2020-06-13 MED ORDER — HEPARIN (PORCINE) IN NACL 1000-0.9 UT/500ML-% IV SOLN
INTRAVENOUS | Status: DC | PRN
  Administered 2020-06-13 (×2): 500 mL

## 2020-06-13 MED ORDER — TRANEXAMIC ACID (OHS) BOLUS VIA INFUSION
15.0000 mg/kg | INTRAVENOUS | Status: DC
  Administered 2020-06-14: 1129.5 mg via INTRAVENOUS
  Filled 2020-06-13: qty 1130

## 2020-06-13 MED ORDER — TRANEXAMIC ACID 1000 MG/10ML IV SOLN
1.5000 mg/kg/h | INTRAVENOUS | Status: DC
  Administered 2020-06-14: 1.5 mg/kg/h via INTRAVENOUS
  Filled 2020-06-13: qty 25

## 2020-06-13 MED ORDER — MAGNESIUM SULFATE 50 % IJ SOLN
40.0000 meq | INTRAMUSCULAR | Status: DC
  Filled 2020-06-13: qty 9.85

## 2020-06-13 MED ORDER — PLASMA-LYTE 148 IV SOLN
INTRAVENOUS | Status: DC
  Filled 2020-06-13: qty 2.5

## 2020-06-13 MED ORDER — MILRINONE LACTATE IN DEXTROSE 20-5 MG/100ML-% IV SOLN
0.3000 ug/kg/min | INTRAVENOUS | Status: DC
  Filled 2020-06-13: qty 100

## 2020-06-13 MED ORDER — DEXMEDETOMIDINE HCL IN NACL 400 MCG/100ML IV SOLN
0.1000 ug/kg/h | INTRAVENOUS | Status: DC
  Administered 2020-06-14: .7 ug/kg/h via INTRAVENOUS
  Filled 2020-06-13: qty 100

## 2020-06-13 MED ORDER — SODIUM CHLORIDE 0.9 % IV SOLN: INTRAVENOUS | Status: AC

## 2020-06-13 MED ORDER — MIDAZOLAM HCL 2 MG/2ML IJ SOLN
INTRAMUSCULAR | Status: DC | PRN
  Administered 2020-06-13: 1 mg via INTRAVENOUS

## 2020-06-13 MED ORDER — FENTANYL CITRATE (PF) 100 MCG/2ML IJ SOLN
INTRAMUSCULAR | Status: AC
  Filled 2020-06-13: qty 2

## 2020-06-13 MED ORDER — HEPARIN (PORCINE) IN NACL 1000-0.9 UT/500ML-% IV SOLN
INTRAVENOUS | Status: AC
  Filled 2020-06-13: qty 1000

## 2020-06-13 MED ORDER — SODIUM CHLORIDE 0.9 % IV SOLN: 250.0000 mL | INTRAVENOUS | Status: DC | PRN

## 2020-06-13 MED ORDER — NITROGLYCERIN IN D5W 200-5 MCG/ML-% IV SOLN
2.0000 ug/min | INTRAVENOUS | Status: DC
  Administered 2020-06-14: 5 ug/min via INTRAVENOUS
  Filled 2020-06-13: qty 250

## 2020-06-13 MED ORDER — CHLORHEXIDINE GLUCONATE CLOTH 2 % EX PADS: 6.0000 | MEDICATED_PAD | Freq: Once | CUTANEOUS | Status: DC

## 2020-06-13 MED ORDER — CHLORHEXIDINE GLUCONATE CLOTH 2 % EX PADS
6.0000 | MEDICATED_PAD | Freq: Once | CUTANEOUS | Status: AC
  Administered 2020-06-13: 6 via TOPICAL

## 2020-06-13 MED ORDER — PERFLUTREN LIPID MICROSPHERE
1.0000 mL | INTRAVENOUS | Status: AC | PRN
  Filled 2020-06-13: qty 10

## 2020-06-13 MED ORDER — PERFLUTREN LIPID MICROSPHERE
INTRAVENOUS | Status: AC
  Administered 2020-06-13: 3 mL
  Filled 2020-06-13: qty 10

## 2020-06-13 SURGICAL SUPPLY — 8 items
CATH INFINITI 5 FR 3DRC (CATHETERS) ×2 IMPLANT
CATH INFINITI 5FR MULTPACK ANG (CATHETERS) ×2 IMPLANT
KIT HEART LEFT (KITS) ×2 IMPLANT
PACK CARDIAC CATHETERIZATION (CUSTOM PROCEDURE TRAY) ×2 IMPLANT
SHEATH PINNACLE 5F 10CM (SHEATH) ×2 IMPLANT
SYR MEDRAD MARK 7 150ML (SYRINGE) ×2 IMPLANT
TRANSDUCER W/STOPCOCK (MISCELLANEOUS) ×2 IMPLANT
WIRE J 3MM .035X145CM (WIRE) ×2 IMPLANT

## 2020-06-13 NOTE — Progress Notes (Signed)
Site area- right  Site Prior to Removal- 0   Pressure Applied For-  20 MInutes   Bedrest Beginning at - 0950   Manual- Yes   Patient Status During Pull- Stable    Post Pull Groin Site- 0   Post Pull Instructions Given- Yes   Post Pull Pulses Present- Yes    Dressing Applied- Tegaderm and Gauze Dressing    Comments:

## 2020-06-13 NOTE — Progress Notes (Signed)
1400-1430 Gave pt OHS booklet, care guide and in the tube handout. Discussed sternal precautions and staying in the tube. Gave IS and pt demonstrated 2000 ml correctly first use. Discussed importance of walking and IS after surgery. Wrote down how to view pre op video. Pt stated her daughter and sister coming from [place] and may be able to assist with care after discharge. Discussed CRP 2 for after recovery. Will follow up after surgery. Luetta Nutting RN BSN 06/13/2020 2:29 PM

## 2020-06-13 NOTE — Consult Note (Addendum)
[facility] CONSULT NOTE VASCULAR SURGERY ASSESSMENT & PLAN:   ASYMPTOMATIC 80% RIGHT CAROTID STENOSIS: By velocity criteria the patient has an 80% right carotid stenosis.  The end-diastolic velocity of 122 suggest that the stenosis is right at 80%.  Looking at the images however the stenosis looks to be less than this.  There is no significant stenosis on the left.  Both vertebral arteries are patent with antegrade flow.  I would favor proceeding with coronary revascularization without concomitant right carotid endarterectomy.  Postoperatively we can obtain a CT angiogram prior to discharge in order to further evaluate the right carotid stenosis as by duplex it was noted that she had a high bifurcation and the plaque extended into the mid ICA.  If this shows that the stenosis is less than 80% we will simply follow this.  If the stenosis is greater than 80% then we would consider elective right carotid endarterectomy versus transcarotid stenting once she has recovered from her CABG.  She is on aspirin and is on a statin.  She is not a smoker.  Waverly Ferrari, MD Office: [phone]   MRN : [id number]  Reason for Consult: Carotid stenosis > 80% on carotid duplex Referring Physician: Dr. Vickey Sages  History of Present Illness: 54 y/o female with history of CAD, HTN, hyperlipidemia and DM undergoes work up for CP.  She has a history of LCx stent in 08/2018.    She has been scheduled for CABG 06/14/2020.  Her pre operative work up revealed asymptomatic right ICA stenosis > 80% on carotid duplex.  We have been asked to consult.  She denise amaurosis, weakness on one side of her body or aphasia.  She states she was seen in the past by a cardiologist and states she had about 50% stenosis at that time which has been within this past year.         Current Facility-Administered Medications  Medication Dose Route Frequency Provider Last Rate Last Admin  . 0.9 %  sodium chloride  infusion   Intravenous Continuous [name], MD 75 mL/hr at 06/12/20 1801 New Bag at 06/12/20 1801  . 0.9 %  sodium chloride infusion   Intravenous Continuous [name], MD 100 mL/hr at 06/13/20 1018 Rate Change at 06/13/20 1018  . 0.9 %  sodium chloride infusion  250 mL Intravenous PRN [name], MD      . acetaminophen (TYLENOL) tablet 650 mg  650 mg Oral Q4H PRN [name], MD   650 mg at 06/12/20 1513  . aspirin EC tablet 81 mg  81 mg Oral Daily [name], MD   81 mg at 06/13/20 0617  . heparin ADULT infusion 100 units/mL (25000 units/232mL sodium chloride 0.45%)  1,000 Units/hr Intravenous Continuous Norva Pavlov, RPH 10 mL/hr at 06/12/20 1759 1,000 Units/hr at 06/12/20 1759  . hydrALAZINE (APRESOLINE) injection 10 mg  10 mg Intravenous Q20 Min PRN [name], MD      . insulin aspart (novoLOG) injection 0-15 Units  0-15 Units Subcutaneous TID WC [name], MD      . insulin aspart (novoLOG) injection 4 Units  4 Units Subcutaneous TID WC [name], MD      . insulin glargine (LANTUS) injection 20 Units  20 Units Subcutaneous BID [name], MD   20 Units at 06/13/20 1107  . labetalol (NORMODYNE) injection 10 mg  10 mg Intravenous Q10 min PRN [name], MD      . lisinopril (ZESTRIL) tablet  10 mg  10 mg Oral Daily [name], MD   10 mg at 06/13/20 1104  . metoprolol succinate (TOPROL-XL) 24 hr tablet 25 mg  25 mg Oral Daily [name], MD   25 mg at 06/12/20 1828  . nitroGLYCERIN (NITROSTAT) SL tablet 0.4 mg  0.4 mg Sublingual Q5 Min x 3 PRN [name], MD      . ondansetron (ZOFRAN) injection 4 mg  4 mg Intravenous Q6H PRN [name], MD      . perflutren lipid microspheres (DEFINITY) IV suspension  1-10 mL Intravenous PRN [name], MD      . rosuvastatin (CRESTOR) tablet 20 mg  20 mg Oral Daily [name], MD   20 mg at 06/13/20 1105  . sodium chloride flush (NS) 0.9 % injection 3 mL  3 mL Intravenous Q12H [name], MD   3  mL at 06/12/20 2317  . sodium chloride flush (NS) 0.9 % injection 3 mL  3 mL Intravenous Q12H [name], MD      . sodium chloride flush (NS) 0.9 % injection 3 mL  3 mL Intravenous PRN [name], MD        Pt meds include: Statin :Yes Betablocker: Yes ASA: Yes Other anticoagulants/antiplatelets: Not at home currently on Heparin for STEMI  Past Medical History:  Diagnosis Date  . Diabetes mellitus without complication (HCC)   . Hypertension   . Vitamin D deficiency     No past surgical history on file.  Social History Social History   Tobacco Use  . Smoking status: Never Smoker  . Smokeless tobacco: Never Used  Substance Use Topics  . Alcohol use: Yes    Comment: occ   . Drug use: No    Family History No family history on file.  Allergies  Allergen Reactions  . Darvon [Propoxyphene] Nausea And Vomiting and Other (See Comments)    Darvocet- "years ago"- "Made me VERY sick."  . Tape Rash and Other (See Comments)    "Tapes and Band-Aids blister my skin"     REVIEW OF SYSTEMS  General: [ ]  Weight loss, [ ]  Fever, [ ]  chills Neurologic: [ ]  Dizziness, [ ]  Blackouts, [ ]  Seizure [ ]  Stroke, [ ]  "Mini stroke", [ ]  Slurred speech, [ ]  Temporary blindness; [ ]  weakness in arms or legs, [ ]  Hoarseness [ ]  Dysphagia Cardiac: [x ] Chest pain/pressure, [ ]  Shortness of breath at rest [ ]  Shortness of breath with exertion, [ ]  Atrial fibrillation or irregular heartbeat  Vascular: [ ]  Pain in legs with walking, [ ]  Pain in legs at rest, [ ]  Pain in legs at night,  [ ]  Non-healing ulcer, [ ]  Blood clot in vein/DVT,   Pulmonary: [ ]  Home oxygen, [ ]  Productive cough, [ ]  Coughing up blood, [ ]  Asthma,  [ ]  Wheezing [ ]  COPD Musculoskeletal:  [ ]  Arthritis, [ ]  Low back pain, [ ]  Joint pain Hematologic: [ ]  Easy Bruising, [ ]  Anemia; [ ]  Hepatitis Gastrointestinal: [ ]  Blood in stool, [ ]  Gastroesophageal Reflux/heartburn, Urinary: [ ]  chronic Kidney disease, [ ]  on HD -  [ ]  MWF or [ ]  TTHS, [ ]  Burning with urination, [ ]  Difficulty urinating Skin: [ ]  Rashes, [ ]  Wounds Psychological: [ ]  Anxiety, [ ]  Depression  Physical Examination Vitals:   06/13/20 1014 06/13/20 1025 06/13/20 1104 06/13/20 1150  BP: 136/70 (!) 142/62 123/70 120/74  Pulse: (!) 54 72  (!) 54  Resp: Temp: 98.6 F (37 C)   97.8 F (36.6 C)  TempSrc: Oral   Oral  SpO2: 100% 98%    Weight:      Height:       Body mass index is 29.41 kg/m.  General:  WDWN in NAD HENT: WNL Eyes: Pupils equal Pulmonary: normal non-labored breathing , without Rales, rhonchi,  wheezing Cardiac: RRR, without  Murmurs, rubs or gallops; No carotid bruits Abdomen: soft, NT, no masses Skin: no rashes, ulcers noted;  no Gangrene , no cellulitis; no open wounds;   Vascular Exam/Pulses:Palpable radial, left femoral, DP pulses B LE Right femoral stick site s/p cardiac cath  Musculoskeletal: no muscle wasting or atrophy; no edema  Neurologic: A&O X 3; Appropriate Affect ;  SENSATION: normal; MOTOR FUNCTION: 5/5 Symmetric Speech is fluent/normal   Significant Diagnostic Studies: CBC Lab Results  Component Value Date   WBC 8.7 06/13/2020   HGB 12.6 06/13/2020   HCT 37.9 06/13/2020   MCV 89.2 06/13/2020   PLT 310 06/13/2020    BMET    Component Value Date/Time   NA 138 06/13/2020 0148   K 4.0 06/13/2020 0148   CL 102 06/13/2020 0148   CO2 25 06/13/2020 0148   GLUCOSE 150 (H) 06/13/2020 0148   BUN 14 06/13/2020 0148   CREATININE 0.94 06/13/2020 0148   CALCIUM 9.1 06/13/2020 0148   GFRNONAA >60 06/13/2020 0148   GFRAA >60 06/08/2017 1649   Estimated Creatinine Clearance: 65 mL/min (by C-G formula based on SCr of 0.94 mg/dL).  COAG Lab Results  Component Value Date   INR 1.0 06/12/2020     Non-Invasive Vascular Imaging:    Right Carotid Findings:  +----------+--------+--------+--------+-----------------------+--------+       PSV cm/sEDV  cm/sStenosisDescribe        Comments  +----------+--------+--------+--------+-----------------------+--------+  CCA Prox 76   17                         +----------+--------+--------+--------+-----------------------+--------+  CCA Distal59   20       smooth and heterogenous      +----------+--------+--------+--------+-----------------------+--------+  ICA Prox 312   122   80-99% smooth and heterogenous      +----------+--------+--------+--------+-----------------------+--------+  ICA Mid  165   52                         +----------+--------+--------+--------+-----------------------+--------+  ICA Distal187   44                         +----------+--------+--------+--------+-----------------------+--------+  ECA    95   11                         +----------+--------+--------+--------+-----------------------+--------+   Portions of this table do not appear on this page.   +----------+--------+-------+----------------+------------+       PSV cm/sEDV cmsDescribe    Arm Pressure  +----------+--------+-------+----------------+------------+  Subclavian132       Multiphasic, WNL        +----------+--------+-------+----------------+------------+   +---------+--------+--+--------+--+---------+  VertebralPSV cm/s75EDV cm/s24Antegrade  +---------+--------+--+--------+--+---------+   Left Carotid Findings:  +----------+--------+--------+--------+-----------------------+--------+       PSV cm/sEDV cm/sStenosisDescribe        Comments  +----------+--------+--------+--------+-----------------------+--------+  CCA Prox 120   32                         +----------+--------+--------+--------+-----------------------+--------+  CCA  Distal101   24       smooth and heterogenous      +----------+--------+--------+--------+-----------------------+--------+  ICA Prox 129   38                         +----------+--------+--------+--------+-----------------------+--------+  ICA Distal117   40                         +----------+--------+--------+--------+-----------------------+--------+  ECA    101   14                         +----------+--------+--------+--------+-----------------------+--------+    +----------+--------+--------+----------------+------------+  SubclavianPSV cm/sEDV cm/sDescribe    Arm Pressure  +----------+--------+--------+----------------+------------+       220       Multiphasic, WNL        +----------+--------+--------+----------------+------------+   +---------+--------+--+--------+-+---------+  VertebralPSV cm/s15EDV cm/s6Antegrade  +---------+--------+--+--------+-+---------+     ABI Findings:  +--------+------------------+-----+---------+--------+  Right  Rt Pressure (mmHg)IndexWaveform Comment   +--------+------------------+-----+---------+--------+  [id number]           triphasic      +--------+------------------+-----+---------+--------+   +--------+------------------+-----+---------+-------+  Left  Lt Pressure (mmHg)IndexWaveform Comment  +--------+------------------+-----+---------+-------+  Brachial120           triphasic      +--------+------------------+-----+---------+-------+      Right Doppler Findings:  +-----------+--------+-----+---------+--------------------+  Site    PressureIndexDoppler Comments        +-----------+--------+-----+---------+--------------------+  Brachial  131      triphasic             +-----------+--------+-----+---------+--------------------+  Radial          triphasic            +-----------+--------+-----+---------+--------------------+  Ulnar          biphasic             +-----------+--------+-----+---------+--------------------+  Palmar Arch            Within normal limits  +-----------+--------+-----+---------+--------------------+      Left Doppler Findings:  +-----------+--------+-----+---------+--------------------+  Site    PressureIndexDoppler Comments        +-----------+--------+-----+---------+--------------------+  Brachial  120      triphasic            +-----------+--------+-----+---------+--------------------+  Radial          triphasic            +-----------+--------+-----+---------+--------------------+  Ulnar          triphasic            +-----------+--------+-----+---------+--------------------+  Palmar Arch            Within normal limits  +-----------+--------+-----+---------+--------------------+      Summary:  Right Carotid: Velocities in the right ICA are consistent with a 80-99%         stenosis.  Pre-surgical evaluation: Right carotid bifurcation is above hyoid.  Narrowing extends from right carotid bifurcation through mid right ICA.  Distal right ICA anatomy is normal.   Left Carotid: Velocities in the left ICA are consistent with a 1-39%  stenosis.  Vertebrals: Bilateral vertebral arteries demonstrate antegrade flow.  Subclavians: Normal flow hemodynamics were seen in bilateral subclavian        arteries.   ASSESSMENT/PLAN:  Asymptomatic right Proximal ICA stenosis 80%  Pending CABG 06/14/2020 Pre-surgical evaluation: Right carotid bifurcation is above hyoid.  Narrowing extends from right carotid bifurcation through mid right ICA.   Distal right ICA anatomy is  normal.  Since she is asymptomatic and the duplex indicated a high bifurcation we may need a CTA of the neck to obtain more information.  This would help us decide if she needs a carotid endarterectomy verses a TCAR.  Further recommendation per Dr. Terrilee Croakickson   Emma Lianne CureMaureen Collins 06/13/2020 12:27 PM

## 2020-06-13 NOTE — Anesthesia Preprocedure Evaluation (Addendum)
Anesthesia Evaluation  Patient identified by MRN, date of birth, ID band Patient awake    Reviewed: Allergy & Precautions, H&P , NPO status , Patient's Chart, lab work & pertinent test results  Airway Mallampati: II  TM Distance: >3 FB Neck ROM: Full    Dental no notable dental hx. (+) Edentulous Upper, Partial Lower, Dental Advisory Given   Pulmonary neg pulmonary ROS,    Pulmonary exam normal breath sounds clear to auscultation       Cardiovascular Exercise Tolerance: Good hypertension, Pt. on medications and Pt. on home beta blockers + angina + CAD   Rhythm:Regular Rate:Normal     Neuro/Psych negative neurological ROS  negative psych ROS   GI/Hepatic negative GI ROS, Neg liver ROS,   Endo/Other  diabetes, Insulin Dependent, Oral Hypoglycemic Agents  Renal/GU negative Renal ROS  negative genitourinary   Musculoskeletal   Abdominal   Peds  Hematology negative hematology ROS (+)   Anesthesia Other Findings   Reproductive/Obstetrics negative OB ROS                            Anesthesia Physical Anesthesia Plan  ASA: IV  Anesthesia Plan: General   Post-op Pain Management:    Induction: Intravenous  PONV Risk Score and Plan: 3 and Midazolam, Treatment may vary due to age or medical condition and Ondansetron  Airway Management Planned: Oral ETT  Additional Equipment: Arterial line, CVP, PA Cath, TEE and Ultrasound Guidance Line Placement  Intra-op Plan:   Post-operative Plan: Post-operative intubation/ventilation  Informed Consent: I have reviewed the patients History and Physical, chart, labs and discussed the procedure including the risks, benefits and alternatives for the proposed anesthesia with the patient or authorized representative who has indicated his/her understanding and acceptance.     Dental advisory given  Plan Discussed with: CRNA  Anesthesia Plan Comments:         Anesthesia Quick Evaluation

## 2020-06-13 NOTE — Progress Notes (Signed)
Pre-CABG Dopplers completed. Refer to "CV Proc" under chart review to view preliminary results.  Preliminary results discussed with Dr. Vickey Sages.  06/13/2020 12:22 PM Eula Fried., MHA, RVT, RDCS, RDMS

## 2020-06-13 NOTE — Consult Note (Signed)
[address]       [place] [address]             [phone]        Kiri Hinderliter [facility] Medical Record #[id number] Date of Birth: 09/03/1965  Referring: No ref. provider found Primary Care: Carron Curie, MD Primary Cardiologist:No primary care provider on file.  Chief Complaint:   No chief complaint on file. SSCP  History of Present Illness:      54 yo lady with h/o DM, HL, HTN and CAD s/p PCI 2 years ago was being evaluated for upper abdominal pain. She was discovered to have cholelithiasis. In preparation for cholecystectomy, she underwent stress test which was +. This lead to LHC today which shows severe multivessel CAD not amenable to PCI. Referral made for CABG. She is asx at present.  Current Activity/ Functional Status: Patient will be independent with mobility/ambulation, transfers, ADL's, IADL's.   Zubrod Score: At the time of surgery this patient's most appropriate activity status/level should be described as: []     0    Normal activity, no symptoms []     1    Restricted in physical strenuous activity but ambulatory, able to do out light work []     2    Ambulatory and capable of self care, unable to do work activities, up and about                 more than 50%  Of the time                            []     3    Only limited self care, in bed greater than 50% of waking hours []     4    Completely disabled, no self care, confined to bed or chair []     5    Moribund  Past Medical History:  Diagnosis Date  . Diabetes mellitus without complication (HCC)   . Hypertension   . Vitamin D deficiency     Past Surgical History:  Procedure Laterality Date  . LEFT HEART CATH AND CORONARY ANGIOGRAPHY N/A 06/13/2020   Procedure: LEFT HEART CATH AND CORONARY ANGIOGRAPHY;  Surgeon: [name], MD;  Location: [facility] INVASIVE CV [facility];  Service: Cardiovascular;  Laterality: N/A;    Social History   Tobacco Use  Smoking Status Never Smoker  Smokeless  Tobacco Never Used    Social History   Substance and Sexual Activity  Alcohol Use Yes   Comment: occ      Allergies  Allergen Reactions  . Darvon [Propoxyphene] Nausea And Vomiting and Other (See Comments)    Darvocet- "years ago"- "Made me VERY sick."  . Tape Rash and Other (See Comments)    "Tapes and Band-Aids blister my skin"    Current Facility-Administered Medications  Medication Dose Route Frequency Provider Last Rate Last Admin  . 0.9 %  sodium chloride infusion   Intravenous Continuous [name], MD 75 mL/hr at 06/12/20 1801 New Bag at 06/12/20 1801  . 0.9 %  sodium chloride infusion  250 mL Intravenous PRN [name], MD      . acetaminophen (TYLENOL) tablet 650 mg  650 mg Oral Q4H PRN [name], MD   650 mg at 06/12/20 1513  . aspirin EC tablet 81 mg  81 mg Oral Daily [name], MD   81 mg at 06/13/20 0617  . [START  ON 06/14/2020] cefUROXime (ZINACEF) 1.5 g in sodium chloride 0.9 % 100 mL IVPB  1.5 g Intravenous To OR Kavontae Pritchard, Merri Brunette, MD      . Melene Muller ON 06/14/2020] cefUROXime (ZINACEF) 750 mg in sodium chloride 0.9 % 100 mL IVPB  750 mg Intravenous To OR Perlie Stene, Merri Brunette, MD      . Melene Muller ON 06/14/2020] chlorhexidine (PERIDEX) 0.12 % solution 15 mL  15 mL Mouth/Throat Once Monesha Monreal, Merri Brunette, MD      . Chlorhexidine Gluconate Cloth 2 % PADS 6 each  6 each Topical Once Linden Dolin, MD       And  . Chlorhexidine Gluconate Cloth 2 % PADS 6 each  6 each Topical Once Linden Dolin, MD      . Melene Muller ON 06/14/2020] dexmedetomidine (PRECEDEX) 400 MCG/100ML (4 mcg/mL) infusion  0.1-0.7 mcg/kg/hr Intravenous To OR Janssen Zee, Merri Brunette, MD      . Melene Muller ON 06/14/2020] EPINEPHrine (ADRENALIN) 4 mg in NS 250 mL (0.016 mg/mL) premix infusion  0-10 mcg/min Intravenous To OR Doriana Mazurkiewicz, Merri Brunette, MD      . Melene Muller ON 06/14/2020] heparin 30,000 units/NS 1000 mL solution for CELLSAVER   Other To OR Basem Yannuzzi Z, MD      . heparin ADULT infusion 100 units/mL (25000  units/239mL sodium chloride 0.45%)  1,000 Units/hr Intravenous Continuous Silvana Newness, RPH 10 mL/hr at 06/13/20 1743 1,000 Units/hr at 06/13/20 1743  . [START ON 06/14/2020] heparin sodium (porcine) 2,500 Units, papaverine 30 mg in electrolyte-148 (PLASMALYTE-148) 500 mL irrigation   Irrigation To OR Liah Morr Z, MD      . insulin aspart (novoLOG) injection 0-15 Units  0-15 Units Subcutaneous TID WC [name], MD   2 Units at 06/13/20 1310  . insulin aspart (novoLOG) injection 4 Units  4 Units Subcutaneous TID WC [name], MD   4 Units at 06/13/20 1831  . insulin glargine (LANTUS) injection 20 Units  20 Units Subcutaneous BID [name], MD   20 Units at 06/13/20 1107  . [START ON 06/14/2020] insulin regular, human (MYXREDLIN) 100 units/ 100 mL infusion   Intravenous To OR Davan Hark Z, MD      . lisinopril (ZESTRIL) tablet 10 mg  10 mg Oral Daily [name], MD   10 mg at 06/13/20 1104  . [START ON 06/14/2020] magnesium sulfate (IV Push/IM) injection 40 mEq  40 mEq Other To OR Iyania Denne, Merri Brunette, MD      . metoprolol succinate (TOPROL-XL) 24 hr tablet 25 mg  25 mg Oral Daily [name], MD   25 mg at 06/12/20 1828  . [START ON 06/14/2020] metoprolol tartrate (LOPRESSOR) tablet 12.5 mg  12.5 mg Oral Once Linden Dolin, MD      . Melene Muller ON 06/14/2020] milrinone (PRIMACOR) 20 MG/100 ML (0.2 mg/mL) infusion  0.3 mcg/kg/min Intravenous To OR Mert Dietrick, Merri Brunette, MD      . nitroGLYCERIN (NITROSTAT) SL tablet 0.4 mg  0.4 mg Sublingual Q5 Min x 3 PRN [name], MD      . Melene Muller ON 06/14/2020] nitroGLYCERIN 50 mg in dextrose 5 % 250 mL (0.2 mg/mL) infusion  2-200 mcg/min Intravenous To OR Lavilla Delamora, Merri Brunette, MD      . Melene Muller ON 06/14/2020] norepinephrine (LEVOPHED) 4mg  in premix infusion  0-40 mcg/min Intravenous To OR Arrion Broaddus, Merri Brunette, MD      . ondansetron (ZOFRAN) injection 4 mg  4 mg Intravenous Q6H PRN [name], MD      . [  START ON 06/14/2020]  phenylephrine (NEOSYNEPHRINE) 20-0.9 MG/250ML-% infusion  30-200 mcg/min Intravenous To OR Evlyn Amason, Merri Brunette, MD      . Melene Muller ON 06/14/2020] potassium chloride injection 80 mEq  80 mEq Other To OR Dulcey Riederer Z, MD      . rosuvastatin (CRESTOR) tablet 20 mg  20 mg Oral Daily [name], MD   20 mg at 06/13/20 1105  . sodium chloride flush (NS) 0.9 % injection 3 mL  3 mL Intravenous Q12H [name], MD   3 mL at 06/12/20 2317  . sodium chloride flush (NS) 0.9 % injection 3 mL  3 mL Intravenous Q12H [name], MD      . sodium chloride flush (NS) 0.9 % injection 3 mL  3 mL Intravenous PRN [name], MD      . Melene Muller ON 06/14/2020] tranexamic acid (CYKLOKAPRON) 2,500 mg in sodium chloride 0.9 % 250 mL (10 mg/mL) infusion  1.5 mg/kg/hr Intravenous To OR Linden Dolin, MD      . Melene Muller ON 06/14/2020] tranexamic acid (CYKLOKAPRON) bolus via infusion - over 30 minutes 1,129.5 mg  15 mg/kg Intravenous To OR Linden Dolin, MD      . Melene Muller ON 06/14/2020] tranexamic acid (CYKLOKAPRON) pump prime solution 151 mg  2 mg/kg Intracatheter To OR Imogene Gravelle, Merri Brunette, MD      . Melene Muller ON 06/14/2020] vancomycin (VANCOREADY) IVPB 1250 mg/250 mL  1,250 mg Intravenous To OR Balraj Brayfield, Merri Brunette, MD        Medications Prior to Admission  Medication Sig Dispense Refill Last Dose  . acetaminophen (TYLENOL) 325 MG tablet Take 325-650 mg by mouth every 6 (six) hours as needed for mild pain (or headaches).   unk  . aspirin 81 MG chewable tablet Chew 81 mg by mouth daily.   06/11/2020 at 0500  . ibuprofen (ADVIL) 200 MG tablet Take 800 mg by mouth every 6 (six) hours as needed for mild pain (or headaches).   unk  . insulin glargine (LANTUS SOLOSTAR) 100 UNIT/ML Solostar Pen Inject 20 Units into the skin See admin instructions. Inject 20 units into the skin in the morning after breakfast and in the evening, after supper/evening meal   06/11/2020 at pm  . lisinopril (ZESTRIL) 10 MG tablet Take 10 mg by  mouth daily.   06/11/2020 at am  . metFORMIN (GLUCOPHAGE) 500 MG tablet Take 500 mg by mouth 2 (two) times daily.   06/11/2020 at pm  . metoprolol succinate (TOPROL-XL) 25 MG 24 hr tablet Take 25 mg by mouth daily.   06/11/2020 at 0500  . rosuvastatin (CRESTOR) 20 MG tablet Take 20 mg by mouth daily.    06/11/2020 at am    No family history on file.   Review of Systems:   ROS Pertinent items noted in HPI and remainder of comprehensive ROS otherwise negative.     Cardiac Review of Systems: Y or  [    ]= no  Chest Pain [    ]  Resting SOB [   ] Exertional SOB  [  ]  Orthopnea [  ]   Pedal Edema [   ]    Palpitations [  ] Syncope  [  ]   Presyncope [   ]  General Review of Systems: [Y] = yes [  ]=no Constitional: recent weight change [  ]; anorexia [  ]; fatigue [  ]; nausea [  ]; night sweats [  ]; fever [  ];  or chills [  ]                                                               Dental: Last Dentist visit:   Eye : blurred vision [  ]; diplopia [   ]; vision changes [  ];  Amaurosis fugax[  ]; Resp: cough [  ];  wheezing[  ];  hemoptysis[  ]; shortness of breath[  ]; paroxysmal nocturnal dyspnea[  ]; dyspnea on exertion[  ]; or orthopnea[  ];  GI:  gallstones[  ], vomiting[  ];  dysphagia[  ]; melena[  ];  hematochezia [  ]; heartburn[  ];   Hx of  Colonoscopy[  ]; GU: kidney stones [  ]; hematuria[  ];   dysuria [  ];  nocturia[  ];  history of     obstruction [  ]; urinary frequency [  ]             Skin: rash, swelling[  ];, hair loss[  ];  peripheral edema[  ];  or itching[  ]; Musculosketetal: myalgias[  ];  joint swelling[  ];  joint erythema[  ];  joint pain[  ];  back pain[  ];  Heme/Lymph: bruising[  ];  bleeding[  ];  anemia[  ];  Neuro: TIA[  ];  headaches[  ];  stroke[  ];  vertigo[  ];  seizures[  ];   paresthesias[  ];  difficulty walking[  ];  Psych:depression[  ]; anxiety[  ];  Endocrine: diabetes[  ];  thyroid dysfunction[  ];            Physical Exam: BP  139/60   Pulse (!) 54   Temp 97.8 F (36.6 C) (Oral)   Resp 16   Ht 5\' 3"  (1.6 m)   Wt 75.3 kg   SpO2 98%   BMI 29.41 kg/m    General appearance: alert and cooperative Head: Normocephalic, without obvious abnormality, atraumatic Neck: no adenopathy, no carotid bruit, no JVD, supple, symmetrical, trachea midline and thyroid not enlarged, symmetric, no tenderness/mass/nodules Resp: clear to auscultation bilaterally Cardio: regular rate and rhythm, S1, S2 normal, no murmur, click, rub or gallop GI: soft, non-tender; bowel sounds normal; no masses,  no organomegaly Extremities: extremities normal, atraumatic, no cyanosis or edema Neurologic: Alert and oriented X 3, normal strength and tone. Normal symmetric reflexes. Normal coordination and gait  Diagnostic Studies & Laboratory data:     Recent Radiology Findings:   CARDIAC CATHETERIZATION  Result Date: 06/13/2020  Prox Cx to Mid Cx lesion is 80% stenosed.  Ost LAD to Prox LAD lesion is 20% stenosed.  Dist LAD lesion is 80% stenosed.  Mid LAD lesion is 40% stenosed.  2nd Diag lesion is 90% stenosed.  Prox LAD lesion is 30% stenosed.  Mid Cx to Dist Cx lesion is 70% stenosed.  3rd Mrg lesion is 95% stenosed.  CVTS consult. Staged angioplasty of LCX stent and LAD if surgery is declined.   ECHOCARDIOGRAM COMPLETE  Result Date: 06/13/2020    ECHOCARDIOGRAM REPORT   Patient Name:   FALEN LEHRMANN Date of Exam: 06/13/2020 Medical Rec #:  06/15/2020      Height:       63.0 in Accession #:  [id number]     Weight:       166.0 lb Date of Birth:  [birth year]      BSA:          1.786 m Patient Age:    54 years       BP:           102/60 mmHg Patient Gender: F              HR:           50 bpm. Exam Location:  Inpatient Procedure: 2D Echo, Color Doppler, Cardiac Doppler and Intracardiac            Opacification Agent Indications:    Acute Cornonary Syndrome I24.9  History:        Patient has no prior history of Echocardiogram examinations.                  Risk Factors:Hypertension and Diabetes.  Sonographer:    Eulah Pont RDCS Referring Phys: [id number] AJAY KADAKIA IMPRESSIONS  1. Left ventricular ejection fraction, by estimation, is 55 to 60%. The left ventricle has normal function. The left ventricle demonstrates regional wall motion abnormalities (see scoring diagram/findings for description). Left ventricular diastolic parameters are consistent with Grade II diastolic dysfunction (pseudonormalization). There is mild hypokinesis of the left ventricular, apical inferior wall.  2. Right ventricular systolic function is mildly reduced. The right ventricular size is normal. There is normal pulmonary artery systolic pressure.  3. Left atrial size was mildly dilated.  4. The mitral valve is degenerative. No evidence of mitral valve regurgitation.  5. The aortic valve is tricuspid. There is mild calcification of the aortic valve. There is mild thickening of the aortic valve. Aortic valve regurgitation is not visualized. Mild to moderate aortic valve sclerosis/calcification is present, without any evidence of aortic stenosis.  6. There is mild (Grade II) atheroma plaque involving the aortic root and ascending aorta.  7. The inferior vena cava is normal in size with greater than 50% respiratory variability, suggesting right atrial pressure of 3 mmHg. FINDINGS  Left Ventricle: Left ventricular ejection fraction, by estimation, is 55 to 60%. The left ventricle has normal function. The left ventricle demonstrates regional wall motion abnormalities. Mild hypokinesis of the left ventricular, apical inferior wall. Definity contrast agent was given IV to delineate the left ventricular endocardial borders. The left ventricular internal cavity size was normal in size. There is no left ventricular hypertrophy. Left ventricular diastolic parameters are consistent with Grade II diastolic dysfunction (pseudonormalization).  LV Wall Scoring: The apical inferior segment and  apex are hypokinetic. The entire anterior wall, entire lateral wall, entire septum, and inferior wall are normal. Right Ventricle: The right ventricular size is normal. No increase in right ventricular wall thickness. Right ventricular systolic function is mildly reduced. There is normal pulmonary artery systolic pressure. The tricuspid regurgitant velocity is 2.41 m/s, and with an assumed right atrial pressure of 3 mmHg, the estimated right ventricular systolic pressure is 26.2 mmHg. Left Atrium: Left atrial size was mildly dilated. Right Atrium: Right atrial size was normal in size. Pericardium: There is no evidence of pericardial effusion. Mitral Valve: The mitral valve is degenerative in appearance. There is mild thickening of the mitral valve leaflet(s). There is mild calcification of the mitral valve leaflet(s). Normal mobility of the mitral valve leaflets. Mild mitral annular calcification. No evidence of mitral valve regurgitation. Tricuspid Valve: The tricuspid valve is normal in structure. Tricuspid valve regurgitation is trivial. Aortic  Valve: The aortic valve is tricuspid. There is mild calcification of the aortic valve. There is mild thickening of the aortic valve. There is mild aortic valve annular calcification. Aortic valve regurgitation is not visualized. Mild to moderate aortic valve sclerosis/calcification is present, without any evidence of aortic stenosis. Pulmonic Valve: The pulmonic valve was normal in structure. Pulmonic valve regurgitation is not visualized. Aorta: The aortic root is normal in size and structure. There is mild (Grade II) atheroma plaque involving the aortic root and ascending aorta. Venous: The inferior vena cava is normal in size with greater than 50% respiratory variability, suggesting right atrial pressure of 3 mmHg. IAS/Shunts: The interatrial septum was not assessed.  LEFT VENTRICLE PLAX 2D LVIDd:         4.10 cm  Diastology LVIDs:         2.40 cm  LV e' medial:    6.96  cm/s LV PW:         0.80 cm  LV E/e' medial:  11.8 LV IVS:        0.80 cm  LV e' lateral:   9.57 cm/s LVOT diam:     1.70 cm  LV E/e' lateral: 8.6 LV SV:         54 LV SV Index:   30 LVOT Area:     2.27 cm  RIGHT VENTRICLE RV S prime:     9.03 cm/s TAPSE (M-mode): 2.4 cm LEFT ATRIUM             Index       RIGHT ATRIUM           Index LA diam:        3.30 cm 1.85 cm/m  RA Area:     13.50 cm LA Vol (A2C):   39.0 ml 21.83 ml/m RA Volume:   28.50 ml  15.95 ml/m LA Vol (A4C):   36.9 ml 20.66 ml/m LA Biplane Vol: 38.5 ml 21.55 ml/m  AORTIC VALVE LVOT Vmax:   99.20 cm/s LVOT Vmean:  72.500 cm/s LVOT VTI:    0.237 m  AORTA Ao Root diam: 2.60 cm Ao Asc diam:  2.60 cm MITRAL VALVE               TRICUSPID VALVE MV Area (PHT): 3.65 cm    TR Peak grad:   23.2 mmHg MV Decel Time: 208 msec    TR Vmax:        241.00 cm/s MV E velocity: 82.30 cm/s MV A velocity: 50.10 cm/s  SHUNTS MV E/A ratio:  1.64        Systemic VTI:  0.24 m                            Systemic Diam: 1.70 cm [name] MD Electronically signed by [name] MD Signature Date/Time: 06/13/2020/7:44:52 PM    Final    VAS US DOPPLER PRE CABG  Result Date: 06/13/2020 PREOPERATIVE VASCULAR EVALUATION  Indications:      Pre-CABG. Risk Factors:     Hypertension, Diabetes. Comparison Study: No prior study on file Performing Technologist: Gertie Fey MHA, RDMS, RVT, RDCS  Examination Guidelines: A complete evaluation includes B-mode imaging, spectral Doppler, color Doppler, and power Doppler as needed of all accessible portions of each vessel. Bilateral testing is considered an integral part of a complete examination. Limited examinations for reoccurring indications may be performed as noted.  Right Carotid Findings: +----------+--------+--------+--------+-----------------------+--------+  PSV cm/sEDV cm/sStenosisDescribe               Comments +----------+--------+--------+--------+-----------------------+--------+ CCA Prox  76       17                                              +----------+--------+--------+--------+-----------------------+--------+ CCA Distal59      20              smooth and heterogenous         +----------+--------+--------+--------+-----------------------+--------+ ICA Prox  312     122     80-99%  smooth and heterogenous         +----------+--------+--------+--------+-----------------------+--------+ ICA Mid   165     52                                              +----------+--------+--------+--------+-----------------------+--------+ ICA Distal187     44                                              +----------+--------+--------+--------+-----------------------+--------+ ECA       95      11                                              +----------+--------+--------+--------+-----------------------+--------+ Portions of this table do not appear on this page. +----------+--------+-------+----------------+------------+           PSV cm/sEDV cmsDescribe        Arm Pressure +----------+--------+-------+----------------+------------+ Subclavian132            Multiphasic, WNL             +----------+--------+-------+----------------+------------+ +---------+--------+--+--------+--+---------+ VertebralPSV cm/s75EDV cm/s24Antegrade +---------+--------+--+--------+--+---------+ Left Carotid Findings: +----------+--------+--------+--------+-----------------------+--------+           PSV cm/sEDV cm/sStenosisDescribe               Comments +----------+--------+--------+--------+-----------------------+--------+ CCA Prox  120     32                                              +----------+--------+--------+--------+-----------------------+--------+ CCA Distal101     24              smooth and heterogenous         +----------+--------+--------+--------+-----------------------+--------+ ICA Prox  129     38                                               +----------+--------+--------+--------+-----------------------+--------+ ICA Distal117     40                                              +----------+--------+--------+--------+-----------------------+--------+  ECA       101     14                                              +----------+--------+--------+--------+-----------------------+--------+ +----------+--------+--------+----------------+------------+ SubclavianPSV cm/sEDV cm/sDescribe        Arm Pressure +----------+--------+--------+----------------+------------+           220             Multiphasic, WNL             +----------+--------+--------+----------------+------------+ +---------+--------+--+--------+-+---------+ VertebralPSV cm/s15EDV cm/s6Antegrade +---------+--------+--+--------+-+---------+  ABI Findings: +--------+------------------+-----+---------+--------+ Right   Rt Pressure (mmHg)IndexWaveform Comment  +--------+------------------+-----+---------+--------+ [id number]                    triphasic         +--------+------------------+-----+---------+--------+ +--------+------------------+-----+---------+-------+ Left    Lt Pressure (mmHg)IndexWaveform Comment +--------+------------------+-----+---------+-------+ Brachial120                    triphasic        +--------+------------------+-----+---------+-------+  Right Doppler Findings: +-----------+--------+-----+---------+--------------------+ Site       PressureIndexDoppler  Comments             +-----------+--------+-----+---------+--------------------+ Brachial   131          triphasic                     +-----------+--------+-----+---------+--------------------+ Radial                  triphasic                     +-----------+--------+-----+---------+--------------------+ Ulnar                   biphasic                      +-----------+--------+-----+---------+--------------------+ Palmar Arch                       Within normal limits +-----------+--------+-----+---------+--------------------+  Left Doppler Findings: +-----------+--------+-----+---------+--------------------+ Site       PressureIndexDoppler  Comments             +-----------+--------+-----+---------+--------------------+ Brachial   120          triphasic                     +-----------+--------+-----+---------+--------------------+ Radial                  triphasic                     +-----------+--------+-----+---------+--------------------+ Ulnar                   triphasic                     +-----------+--------+-----+---------+--------------------+ Palmar Arch                      Within normal limits +-----------+--------+-----+---------+--------------------+  Summary: Right Carotid: Velocities in the right ICA are consistent with a 80-99%                stenosis. Pre-surgical evaluation: Right carotid bifurcation is above hyoid. Narrowing extends from right carotid bifurcation through mid right ICA. Distal right ICA anatomy is normal. Left Carotid: Velocities in  the left ICA are consistent with a 1-39% stenosis. Vertebrals:  Bilateral vertebral arteries demonstrate antegrade flow. Subclavians: Normal flow hemodynamics were seen in bilateral subclavian              arteries. Right Upper Extremity: No significant arterial obstruction detected in the right upper extremity. Doppler waveforms remain within normal limits with right radial compression. Doppler waveforms remain within normal limits with right ulnar compression. Left Upper Extremity: No significant arterial obstruction detected in the left upper extremity. Doppler waveforms remain within normal limits with left radial compression. Doppler waveforms remain within normal limits with left ulnar compression.  Electronically signed by Waverly Ferrarihristopher Dickson MD on 06/13/2020 at 3:14:04 PM.    Final      I have independently reviewed the above  radiologic studies and discussed with the patient   Recent Lab Findings: Lab Results  Component Value Date   WBC 8.7 06/13/2020   HGB 12.6 06/13/2020   HCT 37.9 06/13/2020   PLT 310 06/13/2020   GLUCOSE 150 (H) 06/13/2020   CHOL 187 06/13/2020   TRIG 218 (H) 06/13/2020   HDL 38 (L) 06/13/2020   LDLCALC 105 (H) 06/13/2020   ALT 32 06/12/2020   AST 30 06/12/2020   NA 138 06/13/2020   K 4.0 06/13/2020   CL 102 06/13/2020   CREATININE 0.94 06/13/2020   BUN 14 06/13/2020   CO2 25 06/13/2020   INR 1.0 06/12/2020   HGBA1C 9.5 (H) 06/12/2020      Assessment / Plan:      54 yo lady with known CAD s/p PCI now with angiographic evidence of more diffuse and severe CAD. Agree with recommendation for CABG as best medical treatment. Plan CABG on 06/14/20; she has had the opportunity to ask questions and wishes to proceed.     I  spent 30 minutes counseling the patient face to face.   Madilyne Tadlock Z. Vickey SagesAtkins, MD [phone] 06/13/2020 9:31 PM

## 2020-06-13 NOTE — Interval H&P Note (Signed)
History and Physical Interval Note:  06/13/2020 7:38 AM  Cynthia Hale  has presented today for surgery, with the diagnosis of unstable angina.  The various methods of treatment have been discussed with the patient and family. After consideration of risks, benefits and other options for treatment, the patient has consented to  Procedure(s): LEFT HEART CATH AND CORONARY ANGIOGRAPHY (N/A) as a surgical intervention.  The patient's history has been reviewed, patient examined, no change in status, stable for surgery.  I have reviewed the patient's chart and labs.  Questions were answered to the patient's satisfaction.     Ricki Rodriguez

## 2020-06-13 NOTE — Progress Notes (Signed)
ANTICOAGULATION CONSULT NOTE   Pharmacy Consult for Heparin Indication: chest pain/ACS  Allergies  Allergen Reactions  . Darvon [Propoxyphene] Nausea And Vomiting and Other (See Comments)    Darvocet- "years ago"- "Made me VERY sick."  . Tape Rash and Other (See Comments)    "Tapes and Band-Aids blister my skin"    Patient Measurements: Height: 5\' 3"  (160 cm) Weight: 75.3 kg (166 lb 0.1 oz) IBW/kg (Calculated) : 52.4 Heparin Dosing Weight:  68.6 kg  Vital Signs: Temp: 97.8 F (36.6 C) (11/23 1150) Temp Source: Oral (11/23 1150) BP: 120/74 (11/23 1150) Pulse Rate: 54 (11/23 1150)  Labs: Recent Labs    06/12/20 1456 06/12/20 1725 06/12/20 2245 06/13/20 0148  HGB 12.9  --   --  12.6  HCT 37.8  --   --  37.9  PLT 313  --   --  310  APTT 27  --   --   --   LABPROT 12.9  --   --   --   INR 1.0  --   --   --   HEPARINUNFRC  --   --  0.52 0.38  CREATININE 1.00  --   --  0.94  TROPONINIHS 18* 20*  --   --     Estimated Creatinine Clearance: 65 mL/min (by C-G formula based on SCr of 0.94 mg/dL).   Medical History: Past Medical History:  Diagnosis Date  . Diabetes mellitus without complication (HCC)   . Hypertension   . Vitamin D deficiency     Assessment: 54 yo female with CP and known CAD with stent. She is s/p cath for CABG vs staged PCI. Heparin to restart 8 hours post sheath (sheath removed ~ 9am)  Goal of Therapy:  Heparin level 0.3-0.7 units/ml Monitor platelets by anticoagulation protocol: Yes   Plan:  Restart heparin infusion at 1000 units/hr at 5pm Daily HL and CBC  57, PharmD Clinical Pharmacist **Pharmacist phone directory can now be found on [email or web address] ([email or web address]).  Listed under [facility].

## 2020-06-13 NOTE — Discharge Instructions (Signed)

## 2020-06-13 NOTE — Progress Notes (Signed)
  Echocardiogram 2D Echocardiogram has been performed.  Cynthia Hale 06/13/2020, 11:51 AM

## 2020-06-14 ENCOUNTER — Inpatient Hospital Stay (HOSPITAL_COMMUNITY): Payer: BC Managed Care – PPO | Admitting: Certified Registered"

## 2020-06-14 ENCOUNTER — Inpatient Hospital Stay (HOSPITAL_COMMUNITY): Payer: BC Managed Care – PPO

## 2020-06-14 ENCOUNTER — Inpatient Hospital Stay (HOSPITAL_COMMUNITY)
Admission: AD | Disposition: A | Discharge status: Home Health | Payer: Self-pay | Source: Ambulatory Visit | Attending: Cardiothoracic Surgery

## 2020-06-14 DIAGNOSIS — I2510 Atherosclerotic heart disease of native coronary artery without angina pectoris: Secondary | ICD-10-CM | POA: Diagnosis present

## 2020-06-14 DIAGNOSIS — Z951 Presence of aortocoronary bypass graft: Secondary | ICD-10-CM

## 2020-06-14 DIAGNOSIS — I25110 Atherosclerotic heart disease of native coronary artery with unstable angina pectoris: Secondary | ICD-10-CM | POA: Diagnosis not present

## 2020-06-14 HISTORY — PX: CORONARY ARTERY BYPASS GRAFT: SHX141

## 2020-06-14 HISTORY — PX: TEE WITHOUT CARDIOVERSION: SHX5443

## 2020-06-14 HISTORY — PX: RADIAL ARTERY HARVEST: SHX5067

## 2020-06-14 LAB — POCT I-STAT 7, (LYTES, BLD GAS, ICA,H+H)
Acid-Base Excess: 2 mmol/L (ref 0.0–2.0)
Acid-base deficit: 1 mmol/L (ref 0.0–2.0)
Acid-base deficit: 1 mmol/L (ref 0.0–2.0)
Acid-base deficit: 3 mmol/L — ABNORMAL HIGH (ref 0.0–2.0)
Acid-base deficit: 2 mmol/L (ref 0.0–2.0)
Acid-base deficit: 4 mmol/L — ABNORMAL HIGH (ref 0.0–2.0)
Bicarbonate: 25.3 mmol/L (ref 20.0–28.0)
Bicarbonate: 23.4 mmol/L (ref 20.0–28.0)
Bicarbonate: 26.4 mmol/L (ref 20.0–28.0)
Bicarbonate: 23.4 mmol/L (ref 20.0–28.0)
Bicarbonate: 23.6 mmol/L (ref 20.0–28.0)
Bicarbonate: 22.1 mmol/L (ref 20.0–28.0)
Calcium, Ion: 1.31 mmol/L (ref 1.15–1.40)
Calcium, Ion: 0.93 mmol/L — ABNORMAL LOW (ref 1.15–1.40)
Calcium, Ion: 1.09 mmol/L — ABNORMAL LOW (ref 1.15–1.40)
Calcium, Ion: 1.15 mmol/L (ref 1.15–1.40)
Calcium, Ion: 1.19 mmol/L (ref 1.15–1.40)
Calcium, Ion: 1.22 mmol/L (ref 1.15–1.40)
HCT: 32 % — ABNORMAL LOW (ref 36.0–46.0)
HCT: 21 % — ABNORMAL LOW (ref 36.0–46.0)
HCT: 23 % — ABNORMAL LOW (ref 36.0–46.0)
HCT: 24 % — ABNORMAL LOW (ref 36.0–46.0)
HCT: 23 % — ABNORMAL LOW (ref 36.0–46.0)
HCT: 25 % — ABNORMAL LOW (ref 36.0–46.0)
Hemoglobin: 10.9 g/dL — ABNORMAL LOW (ref 12.0–15.0)
Hemoglobin: 7.1 g/dL — ABNORMAL LOW (ref 12.0–15.0)
Hemoglobin: 7.8 g/dL — ABNORMAL LOW (ref 12.0–15.0)
Hemoglobin: 8.2 g/dL — ABNORMAL LOW (ref 12.0–15.0)
Hemoglobin: 7.8 g/dL — ABNORMAL LOW (ref 12.0–15.0)
Hemoglobin: 8.5 g/dL — ABNORMAL LOW (ref 12.0–15.0)
O2 Saturation: 100 %
O2 Saturation: 100 %
O2 Saturation: 100 %
O2 Saturation: 99 %
O2 Saturation: 98 %
O2 Saturation: 84 %
Potassium: 4.1 mmol/L (ref 3.5–5.1)
Potassium: 4.5 mmol/L (ref 3.5–5.1)
Potassium: 4.7 mmol/L (ref 3.5–5.1)
Potassium: 4.3 mmol/L (ref 3.5–5.1)
Potassium: 4.5 mmol/L (ref 3.5–5.1)
Potassium: 4.5 mmol/L (ref 3.5–5.1)
Sodium: 140 mmol/L (ref 135–145)
Sodium: 145 mmol/L (ref 135–145)
Sodium: 145 mmol/L (ref 135–145)
Sodium: 146 mmol/L — ABNORMAL HIGH (ref 135–145)
Sodium: 146 mmol/L — ABNORMAL HIGH (ref 135–145)
Sodium: 147 mmol/L — ABNORMAL HIGH (ref 135–145)
TCO2: 27 mmol/L (ref 22–32)
TCO2: 24 mmol/L (ref 22–32)
TCO2: 28 mmol/L (ref 22–32)
TCO2: 25 mmol/L (ref 22–32)
TCO2: 25 mmol/L (ref 22–32)
TCO2: 23 mmol/L (ref 22–32)
pCO2 arterial: 46.2 mmHg (ref 32.0–48.0)
pCO2 arterial: 33.8 mmHg (ref 32.0–48.0)
pCO2 arterial: 37.8 mmHg (ref 32.0–48.0)
pCO2 arterial: 48.7 mmHg — ABNORMAL HIGH (ref 32.0–48.0)
pCO2 arterial: 43.9 mmHg (ref 32.0–48.0)
pCO2 arterial: 41.7 mmHg (ref 32.0–48.0)
pH, Arterial: 7.347 — ABNORMAL LOW (ref 7.350–7.450)
pH, Arterial: 7.449 (ref 7.350–7.450)
pH, Arterial: 7.452 — ABNORMAL HIGH (ref 7.350–7.450)
pH, Arterial: 7.289 — ABNORMAL LOW (ref 7.350–7.450)
pH, Arterial: 7.338 — ABNORMAL LOW (ref 7.350–7.450)
pH, Arterial: 7.332 — ABNORMAL LOW (ref 7.350–7.450)
pO2, Arterial: 464 mmHg — ABNORMAL HIGH (ref 83.0–108.0)
pO2, Arterial: 362 mmHg — ABNORMAL HIGH (ref 83.0–108.0)
pO2, Arterial: 330 mmHg — ABNORMAL HIGH (ref 83.0–108.0)
pO2, Arterial: 156 mmHg — ABNORMAL HIGH (ref 83.0–108.0)
pO2, Arterial: 122 mmHg — ABNORMAL HIGH (ref 83.0–108.0)
pO2, Arterial: 53 mmHg — ABNORMAL LOW (ref 83.0–108.0)

## 2020-06-14 LAB — CBC
HCT: 36.8 % (ref 36.0–46.0)
HCT: 30.3 % — ABNORMAL LOW (ref 36.0–46.0)
HCT: 27.6 % — ABNORMAL LOW (ref 36.0–46.0)
Hemoglobin: 12.1 g/dL (ref 12.0–15.0)
Hemoglobin: 9.9 g/dL — ABNORMAL LOW (ref 12.0–15.0)
Hemoglobin: 9 g/dL — ABNORMAL LOW (ref 12.0–15.0)
MCH: 29.3 pg (ref 26.0–34.0)
MCH: 29.8 pg (ref 26.0–34.0)
MCH: 30.2 pg (ref 26.0–34.0)
MCHC: 32.9 g/dL (ref 30.0–36.0)
MCHC: 32.7 g/dL (ref 30.0–36.0)
MCHC: 32.6 g/dL (ref 30.0–36.0)
MCV: 89.1 fL (ref 80.0–100.0)
MCV: 91.3 fL (ref 80.0–100.0)
MCV: 92.6 fL (ref 80.0–100.0)
Platelets: 280 10*3/uL (ref 150–400)
Platelets: 183 10*3/uL (ref 150–400)
Platelets: 202 10*3/uL (ref 150–400)
RBC: 4.13 MIL/uL (ref 3.87–5.11)
RBC: 3.32 MIL/uL — ABNORMAL LOW (ref 3.87–5.11)
RBC: 2.98 MIL/uL — ABNORMAL LOW (ref 3.87–5.11)
RDW: 13.1 % (ref 11.5–15.5)
RDW: 13.1 % (ref 11.5–15.5)
RDW: 13.5 % (ref 11.5–15.5)
WBC: 9.3 10*3/uL (ref 4.0–10.5)
WBC: 8 10*3/uL (ref 4.0–10.5)
WBC: 9.4 10*3/uL (ref 4.0–10.5)
nRBC: 0 % (ref 0.0–0.2)
nRBC: 0 % (ref 0.0–0.2)
nRBC: 0 % (ref 0.0–0.2)

## 2020-06-14 LAB — POCT I-STAT EG7
Acid-Base Excess: 0 mmol/L (ref 0.0–2.0)
Bicarbonate: 24 mmol/L (ref 20.0–28.0)
Calcium, Ion: 1.07 mmol/L — ABNORMAL LOW (ref 1.15–1.40)
HCT: 25 % — ABNORMAL LOW (ref 36.0–46.0)
Hemoglobin: 8.5 g/dL — ABNORMAL LOW (ref 12.0–15.0)
O2 Saturation: 71 %
Potassium: 3.9 mmol/L (ref 3.5–5.1)
Sodium: 147 mmol/L — ABNORMAL HIGH (ref 135–145)
TCO2: 25 mmol/L (ref 22–32)
pCO2, Ven: 37.5 mmHg — ABNORMAL LOW (ref 44.0–60.0)
pH, Ven: 7.414 (ref 7.250–7.430)
pO2, Ven: 37 mmHg (ref 32.0–45.0)

## 2020-06-14 LAB — BASIC METABOLIC PANEL
Anion gap: 9 (ref 5–15)
Anion gap: 9 (ref 5–15)
BUN: 12 mg/dL (ref 6–20)
BUN: 11 mg/dL (ref 6–20)
CO2: 25 mmol/L (ref 22–32)
CO2: 21 mmol/L — ABNORMAL LOW (ref 22–32)
Calcium: 9.2 mg/dL (ref 8.9–10.3)
Calcium: 8 mg/dL — ABNORMAL LOW (ref 8.9–10.3)
Chloride: 105 mmol/L (ref 98–111)
Chloride: 114 mmol/L — ABNORMAL HIGH (ref 98–111)
Creatinine, Ser: 0.92 mg/dL (ref 0.44–1.00)
Creatinine, Ser: 0.96 mg/dL (ref 0.44–1.00)
GFR, Estimated: >60 mL/min (ref >60)
GFR, Estimated: >60 mL/min (ref >60)
Glucose, Bld: 137 mg/dL — ABNORMAL HIGH (ref 70–99)
Glucose, Bld: 110 mg/dL — ABNORMAL HIGH (ref 70–99)
Potassium: 4.3 mmol/L (ref 3.5–5.1)
Potassium: 4.5 mmol/L (ref 3.5–5.1)
Sodium: 139 mmol/L (ref 135–145)
Sodium: 144 mmol/L (ref 135–145)

## 2020-06-14 LAB — POCT I-STAT, CHEM 8
BUN: 10 mg/dL (ref 6–20)
BUN: 9 mg/dL (ref 6–20)
BUN: 9 mg/dL (ref 6–20)
BUN: 9 mg/dL (ref 6–20)
BUN: 9 mg/dL (ref 6–20)
Calcium, Ion: 1.26 mmol/L (ref 1.15–1.40)
Calcium, Ion: 1.33 mmol/L (ref 1.15–1.40)
Calcium, Ion: 1.11 mmol/L — ABNORMAL LOW (ref 1.15–1.40)
Calcium, Ion: 1.1 mmol/L — ABNORMAL LOW (ref 1.15–1.40)
Calcium, Ion: 1.15 mmol/L (ref 1.15–1.40)
Chloride: 105 mmol/L (ref 98–111)
Chloride: 110 mmol/L (ref 98–111)
Chloride: 109 mmol/L (ref 98–111)
Chloride: 109 mmol/L (ref 98–111)
Chloride: 108 mmol/L (ref 98–111)
Creatinine, Ser: 0.6 mg/dL (ref 0.44–1.00)
Creatinine, Ser: 0.6 mg/dL (ref 0.44–1.00)
Creatinine, Ser: 0.6 mg/dL (ref 0.44–1.00)
Creatinine, Ser: 0.6 mg/dL (ref 0.44–1.00)
Creatinine, Ser: 0.7 mg/dL (ref 0.44–1.00)
Glucose, Bld: 160 mg/dL — ABNORMAL HIGH (ref 70–99)
Glucose, Bld: 99 mg/dL (ref 70–99)
Glucose, Bld: 101 mg/dL — ABNORMAL HIGH (ref 70–99)
Glucose, Bld: 109 mg/dL — ABNORMAL HIGH (ref 70–99)
Glucose, Bld: 182 mg/dL — ABNORMAL HIGH (ref 70–99)
HCT: 31 % — ABNORMAL LOW (ref 36.0–46.0)
HCT: 32 % — ABNORMAL LOW (ref 36.0–46.0)
HCT: 23 % — ABNORMAL LOW (ref 36.0–46.0)
HCT: 23 % — ABNORMAL LOW (ref 36.0–46.0)
HCT: 24 % — ABNORMAL LOW (ref 36.0–46.0)
Hemoglobin: 10.5 g/dL — ABNORMAL LOW (ref 12.0–15.0)
Hemoglobin: 10.9 g/dL — ABNORMAL LOW (ref 12.0–15.0)
Hemoglobin: 7.8 g/dL — ABNORMAL LOW (ref 12.0–15.0)
Hemoglobin: 7.8 g/dL — ABNORMAL LOW (ref 12.0–15.0)
Hemoglobin: 8.2 g/dL — ABNORMAL LOW (ref 12.0–15.0)
Potassium: 4.1 mmol/L (ref 3.5–5.1)
Potassium: 3.7 mmol/L (ref 3.5–5.1)
Potassium: 4.7 mmol/L (ref 3.5–5.1)
Potassium: 4.8 mmol/L (ref 3.5–5.1)
Potassium: 4.3 mmol/L (ref 3.5–5.1)
Sodium: 140 mmol/L (ref 135–145)
Sodium: 145 mmol/L (ref 135–145)
Sodium: 145 mmol/L (ref 135–145)
Sodium: 145 mmol/L (ref 135–145)
Sodium: 145 mmol/L (ref 135–145)
TCO2: 24 mmol/L (ref 22–32)
TCO2: 24 mmol/L (ref 22–32)
TCO2: 25 mmol/L (ref 22–32)
TCO2: 25 mmol/L (ref 22–32)
TCO2: 22 mmol/L (ref 22–32)

## 2020-06-14 LAB — PROTIME-INR
INR: 1.3 — ABNORMAL HIGH (ref 0.8–1.2)
Prothrombin Time: 16.1 seconds — ABNORMAL HIGH (ref 11.4–15.2)

## 2020-06-14 LAB — GLUCOSE, CAPILLARY
Glucose-Capillary: 149 mg/dL — ABNORMAL HIGH (ref 70–99)
Glucose-Capillary: 104 mg/dL — ABNORMAL HIGH (ref 70–99)
Glucose-Capillary: 77 mg/dL (ref 70–99)
Glucose-Capillary: 121 mg/dL — ABNORMAL HIGH (ref 70–99)
Glucose-Capillary: 124 mg/dL — ABNORMAL HIGH (ref 70–99)
Glucose-Capillary: 102 mg/dL — ABNORMAL HIGH (ref 70–99)
Glucose-Capillary: 113 mg/dL — ABNORMAL HIGH (ref 70–99)
Glucose-Capillary: 119 mg/dL — ABNORMAL HIGH (ref 70–99)
Glucose-Capillary: 122 mg/dL — ABNORMAL HIGH (ref 70–99)

## 2020-06-14 LAB — APTT: aPTT: 30 seconds (ref 24–36)

## 2020-06-14 LAB — ECHO INTRAOPERATIVE TEE
Height: 63 in
Weight: 2630.4 oz

## 2020-06-14 LAB — HEMOGLOBIN AND HEMATOCRIT, BLOOD
HCT: 25 % — ABNORMAL LOW (ref 36.0–46.0)
Hemoglobin: 8.4 g/dL — ABNORMAL LOW (ref 12.0–15.0)

## 2020-06-14 LAB — PLATELET COUNT: Platelets: 219 10*3/uL (ref 150–400)

## 2020-06-14 LAB — MAGNESIUM: Magnesium: 3.2 mg/dL — ABNORMAL HIGH (ref 1.7–2.4)

## 2020-06-14 SURGERY — CORONARY ARTERY BYPASS GRAFTING (CABG)
Anesthesia: General | Site: Chest | Laterality: Right

## 2020-06-14 MED ORDER — DEXTROSE 50 % IV SOLN: 0.0000 mL | INTRAVENOUS | Status: DC | PRN

## 2020-06-14 MED ORDER — ROCURONIUM BROMIDE 10 MG/ML (PF) SYRINGE
PREFILLED_SYRINGE | INTRAVENOUS | Status: AC
  Filled 2020-06-14: qty 10

## 2020-06-14 MED ORDER — METOPROLOL TARTRATE 5 MG/5ML IV SOLN: 2.5000 mg | INTRAVENOUS | Status: DC | PRN

## 2020-06-14 MED ORDER — BISACODYL 10 MG RE SUPP: 10.0000 mg | Freq: Every day | RECTAL | Status: DC

## 2020-06-14 MED ORDER — HEMOSTATIC AGENTS (NO CHARGE) OPTIME
TOPICAL | Status: DC | PRN
  Administered 2020-06-14 (×2): 1 via TOPICAL

## 2020-06-14 MED ORDER — LACTATED RINGERS IV SOLN: INTRAVENOUS | Status: DC

## 2020-06-14 MED ORDER — SODIUM CHLORIDE 0.9% FLUSH
3.0000 mL | Freq: Two times a day (BID) | INTRAVENOUS | Status: DC
  Administered 2020-06-15 – 2020-06-20 (×10): 3 mL via INTRAVENOUS

## 2020-06-14 MED ORDER — HEMOSTATIC AGENTS (NO CHARGE) OPTIME
TOPICAL | Status: DC | PRN
  Administered 2020-06-14: 1 via TOPICAL

## 2020-06-14 MED ORDER — ALBUMIN HUMAN 5 % IV SOLN
250.0000 mL | INTRAVENOUS | Status: AC | PRN
  Administered 2020-06-14 (×3): 12.5 g via INTRAVENOUS
  Filled 2020-06-14 (×2): qty 250

## 2020-06-14 MED ORDER — PHENYLEPHRINE HCL-NACL 20-0.9 MG/250ML-% IV SOLN
0.0000 ug/min | INTRAVENOUS | Status: DC
  Administered 2020-06-14: 30 ug/min via INTRAVENOUS
  Filled 2020-06-14: qty 250

## 2020-06-14 MED ORDER — ROCURONIUM BROMIDE 10 MG/ML (PF) SYRINGE
PREFILLED_SYRINGE | INTRAVENOUS | Status: AC
  Filled 2020-06-14: qty 20

## 2020-06-14 MED ORDER — OXYCODONE HCL 5 MG PO TABS
5.0000 mg | ORAL_TABLET | ORAL | Status: DC | PRN
  Administered 2020-06-14: 10 mg via ORAL
  Administered 2020-06-15 (×3): 5 mg via ORAL
  Administered 2020-06-15: 10 mg via ORAL
  Administered 2020-06-16 (×2): 5 mg via ORAL
  Administered 2020-06-17: 10 mg via ORAL
  Administered 2020-06-17: 5 mg via ORAL
  Administered 2020-06-17 – 2020-06-19 (×4): 10 mg via ORAL
  Filled 2020-06-14: qty 2
  Filled 2020-06-14: qty 1
  Filled 2020-06-14 (×4): qty 2
  Filled 2020-06-14 (×2): qty 1
  Filled 2020-06-14: qty 2
  Filled 2020-06-14: qty 1
  Filled 2020-06-14: qty 2
  Filled 2020-06-14 (×2): qty 1

## 2020-06-14 MED ORDER — HEPARIN SODIUM (PORCINE) 1000 UNIT/ML IJ SOLN
INTRAMUSCULAR | Status: DC | PRN
  Administered 2020-06-14: 25000 [IU] via INTRAVENOUS

## 2020-06-14 MED ORDER — STERILE WATER FOR INJECTION IV SOLN
INTRAVENOUS | Status: DC | PRN
  Administered 2020-06-14: 30 mL

## 2020-06-14 MED ORDER — VANCOMYCIN HCL IN DEXTROSE 1-5 GM/200ML-% IV SOLN
1000.0000 mg | Freq: Once | INTRAVENOUS | Status: AC
  Administered 2020-06-14: 1000 mg via INTRAVENOUS
  Filled 2020-06-14: qty 200

## 2020-06-14 MED ORDER — ROCURONIUM BROMIDE 10 MG/ML (PF) SYRINGE
PREFILLED_SYRINGE | INTRAVENOUS | Status: DC | PRN
  Administered 2020-06-14: 50 mg via INTRAVENOUS
  Administered 2020-06-14 (×2): 100 mg via INTRAVENOUS

## 2020-06-14 MED ORDER — FENTANYL CITRATE (PF) 250 MCG/5ML IJ SOLN
INTRAMUSCULAR | Status: AC
  Filled 2020-06-14: qty 25

## 2020-06-14 MED ORDER — STERILE WATER FOR INJECTION IJ SOLN
INTRAMUSCULAR | Status: AC
  Filled 2020-06-14: qty 10

## 2020-06-14 MED ORDER — ASPIRIN EC 325 MG PO TBEC
325.0000 mg | DELAYED_RELEASE_TABLET | Freq: Every day | ORAL | Status: DC
  Administered 2020-06-15: 325 mg via ORAL
  Filled 2020-06-14: qty 1

## 2020-06-14 MED ORDER — EPINEPHRINE 1 MG/10ML IJ SOSY
PREFILLED_SYRINGE | INTRAMUSCULAR | Status: DC | PRN
  Administered 2020-06-14: .025 mg via INTRAVENOUS

## 2020-06-14 MED ORDER — BISACODYL 5 MG PO TBEC
10.0000 mg | DELAYED_RELEASE_TABLET | Freq: Every day | ORAL | Status: DC
  Administered 2020-06-15 – 2020-06-19 (×5): 10 mg via ORAL
  Filled 2020-06-14 (×5): qty 2

## 2020-06-14 MED ORDER — EPHEDRINE SULFATE-NACL 50-0.9 MG/10ML-% IV SOSY
PREFILLED_SYRINGE | INTRAVENOUS | Status: DC | PRN
  Administered 2020-06-14 (×2): 5 mg via INTRAVENOUS

## 2020-06-14 MED ORDER — 0.9 % SODIUM CHLORIDE (POUR BTL) OPTIME
TOPICAL | Status: DC | PRN
  Administered 2020-06-14: 5000 mL

## 2020-06-14 MED ORDER — NITROGLYCERIN IN D5W 200-5 MCG/ML-% IV SOLN: 7.0000 ug/min | INTRAVENOUS | Status: AC

## 2020-06-14 MED ORDER — PROTAMINE SULFATE 10 MG/ML IV SOLN
INTRAVENOUS | Status: AC
  Filled 2020-06-14: qty 25

## 2020-06-14 MED ORDER — MAGNESIUM SULFATE 4 GM/100ML IV SOLN
4.0000 g | Freq: Once | INTRAVENOUS | Status: AC
  Administered 2020-06-14: 4 g via INTRAVENOUS
  Filled 2020-06-14: qty 100

## 2020-06-14 MED ORDER — SODIUM CHLORIDE 0.9 % IV SOLN
1.5000 g | Freq: Two times a day (BID) | INTRAVENOUS | Status: AC
  Administered 2020-06-14 – 2020-06-16 (×4): 1.5 g via INTRAVENOUS
  Filled 2020-06-14 (×4): qty 1.5

## 2020-06-14 MED ORDER — BUPIVACAINE HCL (PF) 0.5 % IJ SOLN
INTRAMUSCULAR | Status: AC
  Filled 2020-06-14: qty 30

## 2020-06-14 MED ORDER — SODIUM CHLORIDE 0.45 % IV SOLN
INTRAVENOUS | Status: DC | PRN
  Administered 2020-06-14: 10 mL/h via INTRAVENOUS

## 2020-06-14 MED ORDER — PLASMA-LYTE A IV SOLN: INTRAVENOUS | Status: DC

## 2020-06-14 MED ORDER — STERILE WATER FOR INJECTION IJ SOLN
INTRAMUSCULAR | Status: DC | PRN
  Administered 2020-06-14: 10 mL

## 2020-06-14 MED ORDER — MIDAZOLAM HCL 2 MG/2ML IJ SOLN: 2.0000 mg | INTRAMUSCULAR | Status: DC | PRN

## 2020-06-14 MED ORDER — DEXMEDETOMIDINE HCL IN NACL 400 MCG/100ML IV SOLN
0.0000 ug/kg/h | INTRAVENOUS | Status: DC
  Administered 2020-06-14: 0.3 ug/kg/h via INTRAVENOUS
  Filled 2020-06-14: qty 100

## 2020-06-14 MED ORDER — SODIUM CHLORIDE 0.9 % IV SOLN: 250.0000 mL | INTRAVENOUS | Status: DC

## 2020-06-14 MED ORDER — PROTAMINE SULFATE 10 MG/ML IV SOLN
INTRAVENOUS | Status: DC | PRN
  Administered 2020-06-14: 250 mg via INTRAVENOUS

## 2020-06-14 MED ORDER — SODIUM CHLORIDE 0.9% FLUSH: 3.0000 mL | INTRAVENOUS | Status: DC | PRN

## 2020-06-14 MED ORDER — PLASMA-LYTE 148 IV SOLN: INTRAVENOUS | Status: DC

## 2020-06-14 MED ORDER — VANCOMYCIN HCL 1000 MG IV SOLR
INTRAVENOUS | Status: DC | PRN
  Administered 2020-06-14: 3000 mg via TOPICAL

## 2020-06-14 MED ORDER — INSULIN ASPART 100 UNIT/ML ~~LOC~~ SOLN
0.0000 [IU] | SUBCUTANEOUS | Status: DC
Start: 2020-06-14 — End: 2020-06-14

## 2020-06-14 MED ORDER — ALBUMIN HUMAN 5 % IV SOLN: INTRAVENOUS | Status: DC | PRN

## 2020-06-14 MED ORDER — VANCOMYCIN HCL 1000 MG IV SOLR
INTRAVENOUS | Status: AC
  Filled 2020-06-14: qty 3000

## 2020-06-14 MED ORDER — CHLORHEXIDINE GLUCONATE CLOTH 2 % EX PADS
6.0000 | MEDICATED_PAD | Freq: Every day | CUTANEOUS | Status: DC
  Administered 2020-06-15 – 2020-06-18 (×3): 6 via TOPICAL

## 2020-06-14 MED ORDER — LACTATED RINGERS IV SOLN: 500.0000 mL | Freq: Once | INTRAVENOUS | Status: DC | PRN

## 2020-06-14 MED ORDER — MIDAZOLAM HCL 5 MG/5ML IJ SOLN
INTRAMUSCULAR | Status: DC | PRN
  Administered 2020-06-14: 2 mg via INTRAVENOUS
  Administered 2020-06-14: 3 mg via INTRAVENOUS
  Administered 2020-06-14 (×2): 2 mg via INTRAVENOUS
  Administered 2020-06-14: 3 mg via INTRAVENOUS

## 2020-06-14 MED ORDER — LIDOCAINE HCL (PF) 2 % IJ SOLN
INTRAMUSCULAR | Status: AC
  Filled 2020-06-14: qty 5

## 2020-06-14 MED ORDER — ISOSORBIDE DINITRATE 10 MG PO TABS
10.0000 mg | ORAL_TABLET | Freq: Three times a day (TID) | ORAL | Status: DC
  Filled 2020-06-14: qty 1

## 2020-06-14 MED ORDER — POTASSIUM CHLORIDE 10 MEQ/50ML IV SOLN: 10.0000 meq | INTRAVENOUS | Status: AC

## 2020-06-14 MED ORDER — BUPIVACAINE LIPOSOME 1.3 % IJ SUSP
20.0000 mL | Freq: Once | INTRAMUSCULAR | Status: AC
  Administered 2020-06-14: 20 mL
  Filled 2020-06-14: qty 20

## 2020-06-14 MED ORDER — ACETAMINOPHEN 500 MG PO TABS
1000.0000 mg | ORAL_TABLET | Freq: Four times a day (QID) | ORAL | Status: AC
  Administered 2020-06-14 – 2020-06-19 (×19): 1000 mg via ORAL
  Filled 2020-06-14 (×21): qty 2

## 2020-06-14 MED ORDER — METOPROLOL TARTRATE 25 MG/10 ML ORAL SUSPENSION: 12.5000 mg | Freq: Two times a day (BID) | ORAL | Status: DC

## 2020-06-14 MED ORDER — FENTANYL CITRATE (PF) 250 MCG/5ML IJ SOLN
INTRAMUSCULAR | Status: DC | PRN
  Administered 2020-06-14: 150 ug via INTRAVENOUS
  Administered 2020-06-14: 100 ug via INTRAVENOUS
  Administered 2020-06-14: 50 ug via INTRAVENOUS
  Administered 2020-06-14: 200 ug via INTRAVENOUS
  Administered 2020-06-14 (×4): 100 ug via INTRAVENOUS
  Administered 2020-06-14: 200 ug via INTRAVENOUS
  Administered 2020-06-14: 50 ug via INTRAVENOUS
  Administered 2020-06-14: 100 ug via INTRAVENOUS

## 2020-06-14 MED ORDER — PLATELET POOR PLASMA OPTIME
Status: DC | PRN
  Administered 2020-06-14: 10 mL

## 2020-06-14 MED ORDER — HEPARIN SODIUM (PORCINE) 1000 UNIT/ML IJ SOLN
INTRAMUSCULAR | Status: AC
  Filled 2020-06-14: qty 1

## 2020-06-14 MED ORDER — ACETAMINOPHEN 160 MG/5ML PO SOLN: 1000.0000 mg | Freq: Four times a day (QID) | ORAL | Status: AC

## 2020-06-14 MED ORDER — THROMBIN 5000 UNITS EX SOLR
INTRAVENOUS | Status: DC | PRN
  Administered 2020-06-14: 2 mL

## 2020-06-14 MED ORDER — PROPOFOL 10 MG/ML IV BOLUS
INTRAVENOUS | Status: DC | PRN
  Administered 2020-06-14: 50 mg via INTRAVENOUS

## 2020-06-14 MED ORDER — ACETAMINOPHEN 160 MG/5ML PO SOLN
650.0000 mg | Freq: Once | ORAL | Status: AC
  Administered 2020-06-14: 650 mg
  Filled 2020-06-14: qty 20.3

## 2020-06-14 MED ORDER — DOCUSATE SODIUM 100 MG PO CAPS
200.0000 mg | ORAL_CAPSULE | Freq: Every day | ORAL | Status: DC
  Administered 2020-06-15 – 2020-06-19 (×5): 200 mg via ORAL
  Filled 2020-06-14 (×5): qty 2

## 2020-06-14 MED ORDER — PANTOPRAZOLE SODIUM 40 MG PO TBEC
40.0000 mg | DELAYED_RELEASE_TABLET | Freq: Every day | ORAL | Status: DC
  Administered 2020-06-16 – 2020-06-20 (×5): 40 mg via ORAL
  Filled 2020-06-14 (×5): qty 1

## 2020-06-14 MED ORDER — FAMOTIDINE IN NACL 20-0.9 MG/50ML-% IV SOLN
20.0000 mg | Freq: Two times a day (BID) | INTRAVENOUS | Status: DC
  Administered 2020-06-14: 20 mg via INTRAVENOUS

## 2020-06-14 MED ORDER — INSULIN REGULAR(HUMAN) IN NACL 100-0.9 UT/100ML-% IV SOLN: INTRAVENOUS | Status: DC

## 2020-06-14 MED ORDER — CHLORHEXIDINE GLUCONATE 0.12 % MT SOLN
15.0000 mL | OROMUCOSAL | Status: AC
  Administered 2020-06-14: 15 mL via OROMUCOSAL

## 2020-06-14 MED ORDER — ISOSORBIDE DINITRATE 10 MG PO TABS
10.0000 mg | ORAL_TABLET | Freq: Two times a day (BID) | ORAL | Status: DC
  Administered 2020-06-15: 10 mg via ORAL
  Filled 2020-06-14: qty 1

## 2020-06-14 MED ORDER — PHENYLEPHRINE 40 MCG/ML (10ML) SYRINGE FOR IV PUSH (FOR BLOOD PRESSURE SUPPORT)
PREFILLED_SYRINGE | INTRAVENOUS | Status: AC
  Filled 2020-06-14: qty 10

## 2020-06-14 MED ORDER — LACTATED RINGERS IV SOLN: INTRAVENOUS | Status: DC | PRN

## 2020-06-14 MED ORDER — ONDANSETRON HCL 4 MG/2ML IJ SOLN
4.0000 mg | Freq: Four times a day (QID) | INTRAMUSCULAR | Status: DC | PRN
  Administered 2020-06-16: 4 mg via INTRAVENOUS

## 2020-06-14 MED ORDER — EPHEDRINE 5 MG/ML INJ
INTRAVENOUS | Status: AC
  Filled 2020-06-14: qty 10

## 2020-06-14 MED ORDER — BUPIVACAINE HCL (PF) 0.5 % IJ SOLN
INTRAMUSCULAR | Status: DC | PRN
  Administered 2020-06-14: 30 mL

## 2020-06-14 MED ORDER — SODIUM CHLORIDE 0.9 % IV SOLN
INTRAVENOUS | Status: DC
  Administered 2020-06-14: 10 mL/h via INTRAVENOUS

## 2020-06-14 MED ORDER — ARTIFICIAL TEARS OPHTHALMIC OINT
TOPICAL_OINTMENT | OPHTHALMIC | Status: AC
  Filled 2020-06-14: qty 3.5

## 2020-06-14 MED ORDER — METOPROLOL TARTRATE 12.5 MG HALF TABLET
12.5000 mg | ORAL_TABLET | Freq: Two times a day (BID) | ORAL | Status: DC
  Administered 2020-06-16 – 2020-06-18 (×5): 12.5 mg via ORAL
  Filled 2020-06-14 (×5): qty 1

## 2020-06-14 MED ORDER — MIDAZOLAM HCL (PF) 10 MG/2ML IJ SOLN
INTRAMUSCULAR | Status: AC
  Filled 2020-06-14: qty 2

## 2020-06-14 MED ORDER — DIPHENHYDRAMINE HCL 50 MG/ML IJ SOLN
INTRAMUSCULAR | Status: DC | PRN
  Administered 2020-06-14: 25 mg via INTRAVENOUS

## 2020-06-14 MED ORDER — MIDAZOLAM HCL 2 MG/2ML IJ SOLN
INTRAMUSCULAR | Status: AC
  Filled 2020-06-14: qty 2

## 2020-06-14 MED ORDER — EPINEPHRINE PF 1 MG/ML IJ SOLN
INTRAVENOUS | Status: DC | PRN
  Administered 2020-06-14: 2 ug/min via INTRAVENOUS

## 2020-06-14 MED ORDER — ACETAMINOPHEN 650 MG RE SUPP: 650.0000 mg | Freq: Once | RECTAL | Status: AC

## 2020-06-14 MED ORDER — SODIUM CHLORIDE 0.9 % IV SOLN: INTRAVENOUS | Status: DC | PRN

## 2020-06-14 MED ORDER — MORPHINE SULFATE (PF) 2 MG/ML IV SOLN
1.0000 mg | INTRAVENOUS | Status: DC | PRN
  Administered 2020-06-14 – 2020-06-15 (×5): 2 mg via INTRAVENOUS
  Filled 2020-06-14 (×3): qty 1
  Filled 2020-06-14: qty 2

## 2020-06-14 MED ORDER — ASPIRIN 81 MG PO CHEW: 324.0000 mg | CHEWABLE_TABLET | Freq: Every day | ORAL | Status: DC

## 2020-06-14 MED ORDER — PLASMA-LYTE 148 IV SOLN
INTRAVENOUS | Status: DC | PRN
  Administered 2020-06-14: 500 mL via INTRAVASCULAR

## 2020-06-14 MED ORDER — SODIUM CHLORIDE (PF) 0.9 % IJ SOLN
INTRAMUSCULAR | Status: AC
  Filled 2020-06-14: qty 10

## 2020-06-14 MED ORDER — PLATELET RICH PLASMA OPTIME
Status: DC | PRN
  Administered 2020-06-14: 10 mL

## 2020-06-14 MED ORDER — PROPOFOL 10 MG/ML IV BOLUS
INTRAVENOUS | Status: AC
  Filled 2020-06-14: qty 40

## 2020-06-14 MED ORDER — TRAMADOL HCL 50 MG PO TABS
50.0000 mg | ORAL_TABLET | ORAL | Status: DC | PRN
  Administered 2020-06-15 – 2020-06-20 (×8): 50 mg via ORAL
  Filled 2020-06-14 (×8): qty 1

## 2020-06-14 SURGICAL SUPPLY — 128 items
ADAPTER CARDIO PERF ANTE/RETRO (ADAPTER) ×4 IMPLANT
APPLICATOR TIP COSEAL (VASCULAR PRODUCTS) IMPLANT
APPLIER CLIP 9.375 SM OPEN (CLIP)
BAG DECANTER FOR FLEXI CONT (MISCELLANEOUS) ×4 IMPLANT
BATTERY MAXDRIVER (MISCELLANEOUS) ×4 IMPLANT
BENZOIN TINCTURE PRP APPL 2/3 (GAUZE/BANDAGES/DRESSINGS) ×4 IMPLANT
BLADE CLIPPER SURG (BLADE) IMPLANT
BLADE STERNUM SYSTEM 6 (BLADE) ×4 IMPLANT
BLADE SURG 15 STRL LF DISP TIS (BLADE) ×3 IMPLANT
BLADE SURG 15 STRL SS (BLADE) ×1
BNDG ELASTIC 4X5.8 VLCR STR LF (GAUZE/BANDAGES/DRESSINGS) ×4 IMPLANT
BNDG ELASTIC 6X5.8 VLCR STR LF (GAUZE/BANDAGES/DRESSINGS) IMPLANT
BNDG GAUZE ELAST 4 BULKY (GAUZE/BANDAGES/DRESSINGS) ×4 IMPLANT
CANISTER SUCT 3000ML PPV (MISCELLANEOUS) ×4 IMPLANT
CANISTER WOUNDNEG PRESSURE 500 (CANNISTER) ×4 IMPLANT
CANNULA NON VENT 20FR 12 (CANNULA) ×4 IMPLANT
CATH CPB KIT HENDRICKSON (MISCELLANEOUS) ×4 IMPLANT
CATH ROBINSON RED A/P 18FR (CATHETERS) ×8 IMPLANT
CLIP APPLIE 9.375 SM OPEN (CLIP) IMPLANT
CLIP RETRACTION 3.0MM CORONARY (MISCELLANEOUS) ×4 IMPLANT
CLIP VESOCCLUDE MED 24/CT (CLIP) IMPLANT
CLIP VESOCCLUDE SM WIDE 24/CT (CLIP) ×4 IMPLANT
CONN ST 1/4X3/8  BEN (MISCELLANEOUS) ×3
CONN ST 1/4X3/8 BEN (MISCELLANEOUS) ×9 IMPLANT
COVER MAYO STAND STRL (DRAPES) ×4 IMPLANT
CUFF TOURN SGL QUICK 18X4 (TOURNIQUET CUFF) IMPLANT
CUFF TOURN SGL QUICK 24 (TOURNIQUET CUFF)
CUFF TRNQT CYL 24X4X16.5-23 (TOURNIQUET CUFF) IMPLANT
DERMABOND ADVANCED (GAUZE/BANDAGES/DRESSINGS) ×1
DERMABOND ADVANCED .7 DNX12 (GAUZE/BANDAGES/DRESSINGS) ×3 IMPLANT
DRAIN CHANNEL 32F RND 10.7 FF (WOUND CARE) ×12 IMPLANT
DRAPE CARDIOVASCULAR INCISE (DRAPES) ×1
DRAPE EXTREMITY T 121X128X90 (DISPOSABLE) ×4 IMPLANT
DRAPE HALF SHEET 40X57 (DRAPES) ×4 IMPLANT
DRAPE SLUSH MACHINE 52X66 (DRAPES) ×4 IMPLANT
DRAPE SLUSH/WARMER DISC (DRAPES) IMPLANT
DRAPE SRG 135X102X78XABS (DRAPES) ×3 IMPLANT
DRESSING PEEL AND PLAC PRVNA20 (GAUZE/BANDAGES/DRESSINGS) ×3 IMPLANT
DRESSING PEEL AND PLC PRVNA 13 (GAUZE/BANDAGES/DRESSINGS) ×3 IMPLANT
DRSG AQUACEL AG ADV 3.5X14 (GAUZE/BANDAGES/DRESSINGS) IMPLANT
DRSG PEEL AND PLACE PREVENA 13 (GAUZE/BANDAGES/DRESSINGS) ×4
DRSG PEEL AND PLACE PREVENA 20 (GAUZE/BANDAGES/DRESSINGS) ×4
ELECT CAUTERY BLADE 6.4 (BLADE) ×4 IMPLANT
ELECT REM PT RETURN 9FT ADLT (ELECTROSURGICAL) ×8
ELECTRODE REM PT RTRN 9FT ADLT (ELECTROSURGICAL) ×6 IMPLANT
FELT TEFLON 1X6 (MISCELLANEOUS) ×4 IMPLANT
GAUZE SPONGE 4X4 12PLY STRL (GAUZE/BANDAGES/DRESSINGS) ×8 IMPLANT
GAUZE SPONGE 4X4 12PLY STRL LF (GAUZE/BANDAGES/DRESSINGS) ×4 IMPLANT
GEL ULTRASOUND 20GR AQUASONIC (MISCELLANEOUS) IMPLANT
GLOVE BIO SURGEON STRL SZ 6 (GLOVE) ×4 IMPLANT
GLOVE BIO SURGEON STRL SZ 6.5 (GLOVE) ×32 IMPLANT
GLOVE BIO SURGEON STRL SZ7 (GLOVE) ×4 IMPLANT
GLOVE BIO SURGEON STRL SZ7.5 (GLOVE) ×4 IMPLANT
GLOVE BIOGEL PI IND STRL 7.5 (GLOVE) ×9 IMPLANT
GLOVE BIOGEL PI IND STRL 9 (GLOVE) ×3 IMPLANT
GLOVE BIOGEL PI INDICATOR 7.5 (GLOVE) ×3
GLOVE BIOGEL PI INDICATOR 9 (GLOVE) ×1
GLOVE NEODERM STRL 7.5 LF PF (GLOVE) ×9 IMPLANT
GLOVE SURG NEODERM 7.5  LF PF (GLOVE) ×3
GOWN STRL REUS W/ TWL LRG LVL3 (GOWN DISPOSABLE) ×30 IMPLANT
GOWN STRL REUS W/ TWL XL LVL3 (GOWN DISPOSABLE) ×9 IMPLANT
GOWN STRL REUS W/TWL LRG LVL3 (GOWN DISPOSABLE) ×10
GOWN STRL REUS W/TWL XL LVL3 (GOWN DISPOSABLE) ×3
HEMOSTAT POWDER SURGIFOAM 1G (HEMOSTASIS) IMPLANT
INSERT FOGARTY XLG (MISCELLANEOUS) ×4 IMPLANT
INSERT SUTURE HOLDER (MISCELLANEOUS) ×4 IMPLANT
KIT APPLICATOR RATIO 11:1 (KITS) ×4 IMPLANT
KIT BASIN OR (CUSTOM PROCEDURE TRAY) ×4 IMPLANT
KIT SUCTION CATH 14FR (SUCTIONS) ×4 IMPLANT
KIT TURNOVER KIT B (KITS) ×4 IMPLANT
KIT VASOVIEW HEMOPRO 2 VH 4000 (KITS) IMPLANT
MARKER GRAFT CORONARY BYPASS (MISCELLANEOUS) IMPLANT
NEEDLE 18GX1X1/2 (RX/OR ONLY) (NEEDLE) ×4 IMPLANT
NS IRRIG 1000ML POUR BTL (IV SOLUTION) ×20 IMPLANT
PACK E OPEN HEART (SUTURE) ×4 IMPLANT
PACK OPEN HEART (CUSTOM PROCEDURE TRAY) ×4 IMPLANT
PACK PLATELET PROCEDURE 60 (MISCELLANEOUS) ×4 IMPLANT
PACK SPY-PHI (KITS) ×4 IMPLANT
PAD ARMBOARD 7.5X6 YLW CONV (MISCELLANEOUS) ×8 IMPLANT
PAD ELECT DEFIB RADIOL ZOLL (MISCELLANEOUS) ×4 IMPLANT
PENCIL BUTTON HOLSTER BLD 10FT (ELECTRODE) ×4 IMPLANT
PLATE STERNAL 2.3X208 14H 2-PK (Plate) ×4 IMPLANT
POSITIONER HEAD DONUT 9IN (MISCELLANEOUS) ×4 IMPLANT
POWDER SURGICEL 3.0 GRAM (HEMOSTASIS) ×4 IMPLANT
PUNCH AORTIC ROTATE 4.0MM (MISCELLANEOUS) ×4 IMPLANT
SEALANT SURG COSEAL 8ML (VASCULAR PRODUCTS) ×8 IMPLANT
SET CARDIOPLEGIA MPS 5001102 (MISCELLANEOUS) ×4 IMPLANT
SHEARS HARMONIC 9CM CVD (BLADE) IMPLANT
STAPLER VISISTAT 35W (STAPLE) ×4 IMPLANT
SUPPORT HEART JANKE-BARRON (MISCELLANEOUS) ×4 IMPLANT
SUT BONE WAX W31G (SUTURE) ×4 IMPLANT
SUT MNCRL AB 3-0 PS2 18 (SUTURE) ×8 IMPLANT
SUT PDS AB 1 CTX 36 (SUTURE) ×8 IMPLANT
SUT PROLENE 3 0 SH DA (SUTURE) ×4 IMPLANT
SUT PROLENE 5 0 C 1 36 (SUTURE) ×4 IMPLANT
SUT PROLENE 6 0 C 1 30 (SUTURE) ×12 IMPLANT
SUT PROLENE 7 0 BV 1 (SUTURE) ×4 IMPLANT
SUT PROLENE 8 0 BV175 6 (SUTURE) IMPLANT
SUT PROLENE BLUE 7 0 (SUTURE) ×4 IMPLANT
SUT SILK  1 MH (SUTURE) ×2
SUT SILK 1 MH (SUTURE) ×6 IMPLANT
SUT SILK 2 0 SH CR/8 (SUTURE) IMPLANT
SUT SILK 3 0 SH CR/8 (SUTURE) IMPLANT
SUT STEEL 6MS V (SUTURE) ×4 IMPLANT
SUT STEEL SZ 6 DBL 3X14 BALL (SUTURE) ×4 IMPLANT
SUT VIC AB 2-0 CT1 27 (SUTURE) ×1
SUT VIC AB 2-0 CT1 TAPERPNT 27 (SUTURE) ×3 IMPLANT
SUT VIC AB 2-0 CTX 27 (SUTURE) IMPLANT
SUT VIC AB 3-0 SH 27 (SUTURE)
SUT VIC AB 3-0 SH 27X BRD (SUTURE) IMPLANT
SUT VIC AB 3-0 X1 27 (SUTURE) IMPLANT
SYR 10ML LL (SYRINGE) IMPLANT
SYR 30ML LL (SYRINGE) ×4 IMPLANT
SYR 3ML LL SCALE MARK (SYRINGE) IMPLANT
SYR 50ML SLIP (SYRINGE) IMPLANT
SYR BULB IRRIG 60ML STRL (SYRINGE) ×8 IMPLANT
SYSTEM SAHARA CHEST DRAIN ATS (WOUND CARE) ×4 IMPLANT
TAPE CLOTH SURG 4X10 WHT LF (GAUZE/BANDAGES/DRESSINGS) ×4 IMPLANT
TAPE PAPER 2X10 WHT MICROPORE (GAUZE/BANDAGES/DRESSINGS) ×4 IMPLANT
TIP DUAL SPRAY TOPICAL (TIP) ×8 IMPLANT
TOWEL GREEN STERILE (TOWEL DISPOSABLE) ×4 IMPLANT
TOWEL GREEN STERILE FF (TOWEL DISPOSABLE) IMPLANT
TRAY FOLEY SLVR 14FR TEMP STAT (SET/KITS/TRAYS/PACK) ×4 IMPLANT
TRAY FOLEY SLVR 16FR TEMP STAT (SET/KITS/TRAYS/PACK) IMPLANT
TUBE CONNECTING 20X1/4 (TUBING) ×4 IMPLANT
TUBING LAP HI FLOW INSUFFLATIO (TUBING) IMPLANT
UNDERPAD 30X36 HEAVY ABSORB (UNDERPADS AND DIAPERS) ×8 IMPLANT
WATER STERILE IRR 1000ML POUR (IV SOLUTION) ×8 IMPLANT

## 2020-06-14 NOTE — Anesthesia Procedure Notes (Signed)
Arterial Line Insertion Start/End11/24/2021 6:45 AM, 06/14/2020 7:00 AM Performed by: Rachel Moulds, CRNA, CRNA  Patient location: Pre-op. Preanesthetic checklist: patient identified, IV checked, site marked, risks and benefits discussed, surgical consent, monitors and equipment checked, pre-op evaluation, timeout performed and anesthesia consent Lidocaine 1% used for infiltration Left, radial was placed Catheter size: 20 Fr Hand hygiene performed  and maximum sterile barriers used   Attempts: 1 Procedure performed without using ultrasound guided technique. Ultrasound Notes:anatomy identified, needle tip was noted to be adjacent to the nerve/plexus identified and no ultrasound evidence of intravascular and/or intraneural injection Following insertion, dressing applied. Post procedure assessment: normal and unchanged  Patient tolerated the procedure well with no immediate complications.

## 2020-06-14 NOTE — Progress Notes (Signed)
Patient ID: Cynthia Hale, female   DOB: 05/12/1966, 54 y.o.   MRN: [id number]  TCTS Evening Rounds:   Hemodynamically stable  CI = 1.8 on neo  Has started to wake up on vent. Ready for extubation soon.  Urine output good  CT output low  CBC    Component Value Date/Time   WBC 8.0 06/14/2020 1404   RBC 3.32 (L) 06/14/2020 1404   HGB 9.9 (L) 06/14/2020 1404   HCT 30.3 (L) 06/14/2020 1404   PLT 183 06/14/2020 1404   MCV 91.3 06/14/2020 1404   MCH 29.8 06/14/2020 1404   MCHC 32.7 06/14/2020 1404   RDW 13.1 06/14/2020 1404   LYMPHSABS 2.6 06/12/2020 1456   MONOABS 0.4 06/12/2020 1456   EOSABS 0.0 06/12/2020 1456   BASOSABS 0.1 06/12/2020 1456     BMET    Component Value Date/Time   NA 145 06/14/2020 1254   K 4.3 06/14/2020 1254   CL 108 06/14/2020 1254   CO2 25 06/14/2020 0229   GLUCOSE 182 (H) 06/14/2020 1254   BUN 9 06/14/2020 1254   CREATININE 0.70 06/14/2020 1254   CALCIUM 9.2 06/14/2020 0229   GFRNONAA >60 06/14/2020 0229   GFRAA >60 06/08/2017 1649     A/P:  Stable postop course. Continue current plans

## 2020-06-14 NOTE — Anesthesia Procedure Notes (Signed)
Central Venous Catheter Insertion Performed by: Albertha Ghee, MD, anesthesiologist Start/End11/24/2021 6:45 AM, 06/14/2020 6:55 AM Patient location: Pre-op. Preanesthetic checklist: patient identified, IV checked, site marked, risks and benefits discussed, surgical consent, monitors and equipment checked, pre-op evaluation, timeout performed and anesthesia consent Lidocaine 1% used for infiltration and patient sedated Hand hygiene performed  and maximum sterile barriers used  Catheter size: 9 Fr Central line was placed.MAC introducer Procedure performed using ultrasound guided technique. Ultrasound Notes:anatomy identified, needle tip was noted to be adjacent to the nerve/plexus identified, no ultrasound evidence of intravascular and/or intraneural injection and image(s) printed for medical record Attempts: 1 Following insertion, line sutured and dressing applied. Post procedure assessment: blood return through all ports, free fluid flow and no air  Patient tolerated the procedure well with no immediate complications.

## 2020-06-14 NOTE — Brief Op Note (Addendum)
06/12/2020 - 06/14/2020  12:29 PM  PATIENT:  Cynthia Hale  54 y.o. female  PRE-OPERATIVE DIAGNOSIS:  CORONARY ARTERY DISEASE  POST-OPERATIVE DIAGNOSIS:  CORONARY ARTERY DISEASE  PROCEDURE:  TRANSESOPHAGEAL ECHOCARDIOGRAM (TEE), CORONARY ARTERY BYPASS GRAFTING (CABG)x 4 (LIMA to LAD, RIMA to OM1, LEFT RADIAL ARTERY SEQUENTIALLY to DIAGONAL and OM2) with bilateral IMAs and RIGHT RADIAL ARTERY HARVEST   INDOCYANINE GREEN FLUORESCENCE IMAGING (ICG)  RIGHT RADIAL ARTERY HARVEST TIME: 22 minutes;RIGHT RADIAL ARTERY PREP TIME: 5 minutes  SURGEON:  Surgeon(s) and Role:    Linden Dolin, MD - Primary  PHYSICIAN ASSISTANT: Doree Fudge PA-C  ANESTHESIA:   general  EBL:  Per anesthesia and perfusion record  DRAINS: Chest tubes placed in the mediastinal and pleural spaces   COUNTS CORRECT:  YES  DICTATION: .Dragon Dictation  PLAN OF CARE: Admit to inpatient   PATIENT DISPOSITION:  ICU - intubated and hemodynamically stable.   Delay start of Pharmacological VTE agent (>24hrs) due to surgical blood loss or risk of bleeding: yes  BASELINE WEIGHT: 74.6 kg   Agree with documentation. Zea Kostka Z. Vickey Sages, MD [phone]

## 2020-06-14 NOTE — Progress Notes (Signed)
2145  Notified Bartle MD of no cuff leak upon getting ready to extubate, pt met all other parameters. MD states to go ahead and extubate.   2200 Pt was extubated to 4L [place], pt is alert and oriented, is able to talk, reaches up to 250 on incentive spirometer and is requesting water. Pt reports her back is really hurting. Pt was reassured and medication will be given, see MAR

## 2020-06-14 NOTE — Transfer of Care (Signed)
Immediate Anesthesia Transfer of Care Note  Patient: Cynthia Hale  Procedure(s) Performed: CORONARY ARTERY BYPASS GRAFTING (CABG) TIMES FOUR USING LIMA TO LAD;RIMA TO OM1; RADIAL ARTERY SEQUENCED DIAG TO OM2 (N/A Chest) RADIAL ARTERY HARVEST (Right Arm Lower) TRANSESOPHAGEAL ECHOCARDIOGRAM (TEE) (N/A ) INDOCYANINE GREEN FLUORESCENCE IMAGING (ICG) (N/A )  Patient Location: ICU  Anesthesia Type:General  Level of Consciousness: Patient remains intubated per anesthesia plan  Airway & Oxygen Therapy: Patient remains intubated per anesthesia plan and Patient placed on Ventilator (see vital sign flow sheet for setting)  Post-op Assessment: Report given to RN and Post -op Vital signs reviewed and stable  Post vital signs: Reviewed and stable  Last Vitals:  Vitals Value Taken Time  BP    Temp 35.4 C 06/14/20 1404  Pulse    Resp 12 06/14/20 1404  SpO2    Vitals shown include unvalidated device data.  Last Pain:  Vitals:   06/14/20 0138  TempSrc: Oral  PainSc:       Patients Stated Pain Goal: 0 (06/13/20 2114)  Complications: No complications documented.

## 2020-06-14 NOTE — Anesthesia Procedure Notes (Signed)
Central Venous Catheter Insertion Performed by: Achille Rich, MD, anesthesiologist Start/End11/24/2021 6:55 AM, 06/14/2020 6:57 AM Patient location: Pre-op. Preanesthetic checklist: patient identified, IV checked, site marked, risks and benefits discussed, surgical consent, monitors and equipment checked, pre-op evaluation, timeout performed and anesthesia consent Position: Trendelenburg Patient sedated Hand hygiene performed  and maximum sterile barriers used  PA cath was placed.Swan type:thermodilution Procedure performed without using ultrasound guided technique. Attempts: 1 Patient tolerated the procedure well with no immediate complications.

## 2020-06-14 NOTE — H&P (Signed)
History and Physical Interval Note:  06/14/2020 7:23 AM  Cynthia Hale  has presented today for surgery, with the diagnosis of CAD.  The various methods of treatment have been discussed with the patient and family. After consideration of risks, benefits and other options for treatment, the patient has consented to  Procedure(s) with comments: CORONARY ARTERY BYPASS GRAFTING (CABG) (N/A) - POSSIBLE BIMA RADIAL ARTERY HARVEST (Right) TRANSESOPHAGEAL ECHOCARDIOGRAM (TEE) (N/A) INDOCYANINE GREEN FLUORESCENCE IMAGING (ICG) (N/A) as a surgical intervention.  The patient's history has been reviewed, patient examined, no change in status, stable for surgery.  I have reviewed the patient's chart and labs.  Questions were answered to the patient's satisfaction.     Linden Dolin

## 2020-06-14 NOTE — Anesthesia Postprocedure Evaluation (Signed)
Anesthesia Post Note  Patient: Adalay Azucena  Procedure(s) Performed: CORONARY ARTERY BYPASS GRAFTING (CABG) TIMES FOUR USING LIMA TO LAD;RIMA TO OM1; RADIAL ARTERY SEQUENCED DIAG TO OM2 (N/A Chest) RADIAL ARTERY HARVEST (Right Arm Lower) TRANSESOPHAGEAL ECHOCARDIOGRAM (TEE) (N/A ) INDOCYANINE GREEN FLUORESCENCE IMAGING (ICG) (N/A )     Patient location during evaluation: SICU Anesthesia Type: General Level of consciousness: sedated Pain management: pain level controlled Vital Signs Assessment: post-procedure vital signs reviewed and stable Respiratory status: patient remains intubated per anesthesia plan Cardiovascular status: stable Postop Assessment: no apparent nausea or vomiting Anesthetic complications: no   No complications documented.  Last Vitals:  Vitals:   06/14/20 0456 06/14/20 1406  BP: (!) 147/72 (!) 113/55  Pulse: 67 75  Resp:  12  Temp:  (!) 35.6 C  SpO2:  99%    Last Pain:  Vitals:   06/14/20 0138  TempSrc: Oral  PainSc:                  Joan Herschberger,W. EDMOND

## 2020-06-14 NOTE — Progress Notes (Signed)
Patient did not have a cuff leak prior to extubation. MD notified and gave verbal okay to go ahead and extubate. Patient tolerated extubation well. No stridor noted at this time.

## 2020-06-14 NOTE — Procedures (Addendum)
Extubation Procedure Note  Patient Details:   Name: Cynthia Hale DOB: 26-Sep-1965 MRN: [id number]   Airway Documentation:    Vent end date: 06/14/20 Vent end time: 2150   Evaluation  O2 sats: stable throughout Complications: No apparent complications Patient did tolerate procedure well. Bilateral Breath Sounds: Clear, Diminished   Yes   Patient extubated to 4L [place]. NO cuff leak noted, MD notified. Patient able to speak.   VC: .45  NIF: -25  Kimberlie Csaszar A Kirsten Mckone 06/14/2020, 10:47 PM

## 2020-06-14 NOTE — Anesthesia Procedure Notes (Signed)
Procedure Name: Intubation Date/Time: 06/14/2020 7:54 AM Performed by: Ponciano Ort, CRNA Pre-anesthesia Checklist: Patient identified, Emergency Drugs available, Suction available and Patient being monitored Patient Re-evaluated:Patient Re-evaluated prior to induction Oxygen Delivery Method: Circle system utilized Preoxygenation: Pre-oxygenation with 100% oxygen Induction Type: IV induction Ventilation: Mask ventilation without difficulty Laryngoscope Size: Miller and 2 Grade View: Grade I Tube type: Oral Tube size: 7.5 mm Number of attempts: 1 Airway Equipment and Method: Stylet and Oral airway Placement Confirmation: ETT inserted through vocal cords under direct vision,  positive ETCO2 and breath sounds checked- equal and bilateral Secured at: 22 cm Tube secured with: Tape Dental Injury: Teeth and Oropharynx as per pre-operative assessment

## 2020-06-14 NOTE — Hospital Course (Addendum)
HPI: This is a 54 yo lady with h/o DM, HL, HTN and CAD s/p PCI 2 years ago was being evaluated for upper abdominal pain. She was discovered to have cholelithiasis. In preparation for cholecystectomy, she underwent stress test which was +. This lead to [facility] on 11/23 which shows severe multivessel CAD not amenable to PCI. Referral made for CABG. She is asx at present. Pre operative carotid duplex US showed no significant left internal carotid artery stenosis and a 80-99% right internal carotid artery stenosis (right carotid bifurcation is above hyoid and narrowing extends from right carotid bifurcation through mid right ICA). Vascular surgery was consulted. Since she is asymptomatic and the duplex indicated a high bifurcation, we may need a CTA of the neck to obtain more information.  This would help [place] decide if she needs a carotid endarterectomy verses a TCAR. Patient to be worked up after she recovers from coronary artery bypass grafting surgery. Patient underwent a CABG x 4on 06/14/2020.  Hospital Course: Patient was extubated late the evening of surgery. She was initially AAI paced. She was weaned off Neo Synephrine drip. [name], a line, and foley were removed early in her post operative course. Chest tubes remained for several days then were removed. She was started on Lopressor ane Imdur (for left radial artery harvest). She was volume overloaded and diuresed. She was started on Plavix and ec asa was decreased to 81 mg daily for ACS. She was weaned off the Insulin drip. She will be restarted on Metformin closer to discharge. Her pre op HGA1C was 9.5. She will need close medical follow up after discharge regarding further diabetes management and surveillance of HGA1C. She was felt surgically stable for transfer from the ICU to the progressive care unit on 06/18/2020.  She was evaluated by PT/OT who recommended home health PT/OT.  They also recommended home equipment.  This has been arranged. She continued  to require home oxygen which was being weaned as tolerated. Her pacer was turned off *** and her EPW were removed on ***. She went for a CT scan of the neck on *** and it showed ***. Dr. Edilia Bo continued to follow along and provid recommendations.

## 2020-06-15 ENCOUNTER — Inpatient Hospital Stay (HOSPITAL_COMMUNITY): Payer: BC Managed Care – PPO

## 2020-06-15 LAB — CBC
HCT: 28 % — ABNORMAL LOW (ref 36.0–46.0)
HCT: 26 % — ABNORMAL LOW (ref 36.0–46.0)
Hemoglobin: 8.7 g/dL — ABNORMAL LOW (ref 12.0–15.0)
Hemoglobin: 8.3 g/dL — ABNORMAL LOW (ref 12.0–15.0)
MCH: 29.2 pg (ref 26.0–34.0)
MCH: 30.1 pg (ref 26.0–34.0)
MCHC: 31.1 g/dL (ref 30.0–36.0)
MCHC: 31.9 g/dL (ref 30.0–36.0)
MCV: 94 fL (ref 80.0–100.0)
MCV: 94.2 fL (ref 80.0–100.0)
Platelets: 217 10*3/uL (ref 150–400)
Platelets: 192 10*3/uL (ref 150–400)
RBC: 2.98 MIL/uL — ABNORMAL LOW (ref 3.87–5.11)
RBC: 2.76 MIL/uL — ABNORMAL LOW (ref 3.87–5.11)
RDW: 13.6 % (ref 11.5–15.5)
RDW: 13.7 % (ref 11.5–15.5)
WBC: 11.7 10*3/uL — ABNORMAL HIGH (ref 4.0–10.5)
WBC: 11.2 10*3/uL — ABNORMAL HIGH (ref 4.0–10.5)
nRBC: 0 % (ref 0.0–0.2)
nRBC: 0 % (ref 0.0–0.2)

## 2020-06-15 LAB — COMPREHENSIVE METABOLIC PANEL
ALT: 40 U/L (ref 0–44)
AST: 85 U/L — ABNORMAL HIGH (ref 15–41)
Albumin: 3.6 g/dL (ref 3.5–5.0)
Alkaline Phosphatase: 41 U/L (ref 38–126)
Anion gap: 10 (ref 5–15)
BUN: 14 mg/dL (ref 6–20)
CO2: 21 mmol/L — ABNORMAL LOW (ref 22–32)
Calcium: 8.1 mg/dL — ABNORMAL LOW (ref 8.9–10.3)
Chloride: 101 mmol/L (ref 98–111)
Creatinine, Ser: 1.16 mg/dL — ABNORMAL HIGH (ref 0.44–1.00)
GFR, Estimated: 55 mL/min — ABNORMAL LOW (ref >60)
Glucose, Bld: 203 mg/dL — ABNORMAL HIGH (ref 70–99)
Potassium: 4 mmol/L (ref 3.5–5.1)
Sodium: 132 mmol/L — ABNORMAL LOW (ref 135–145)
Total Bilirubin: 1.6 mg/dL — ABNORMAL HIGH (ref 0.3–1.2)
Total Protein: 5.7 g/dL — ABNORMAL LOW (ref 6.5–8.1)

## 2020-06-15 LAB — GLUCOSE, CAPILLARY
Glucose-Capillary: 103 mg/dL — ABNORMAL HIGH (ref 70–99)
Glucose-Capillary: 104 mg/dL — ABNORMAL HIGH (ref 70–99)
Glucose-Capillary: 106 mg/dL — ABNORMAL HIGH (ref 70–99)
Glucose-Capillary: 114 mg/dL — ABNORMAL HIGH (ref 70–99)
Glucose-Capillary: 142 mg/dL — ABNORMAL HIGH (ref 70–99)
Glucose-Capillary: 162 mg/dL — ABNORMAL HIGH (ref 70–99)
Glucose-Capillary: 166 mg/dL — ABNORMAL HIGH (ref 70–99)
Glucose-Capillary: 193 mg/dL — ABNORMAL HIGH (ref 70–99)
Glucose-Capillary: 95 mg/dL (ref 70–99)
Glucose-Capillary: 96 mg/dL (ref 70–99)

## 2020-06-15 LAB — BASIC METABOLIC PANEL
Anion gap: 8 (ref 5–15)
BUN: 12 mg/dL (ref 6–20)
CO2: 21 mmol/L — ABNORMAL LOW (ref 22–32)
Calcium: 8.2 mg/dL — ABNORMAL LOW (ref 8.9–10.3)
Chloride: 113 mmol/L — ABNORMAL HIGH (ref 98–111)
Creatinine, Ser: 0.94 mg/dL (ref 0.44–1.00)
GFR, Estimated: >60 mL/min (ref >60)
Glucose, Bld: 109 mg/dL — ABNORMAL HIGH (ref 70–99)
Potassium: 4.4 mmol/L (ref 3.5–5.1)
Sodium: 142 mmol/L (ref 135–145)

## 2020-06-15 LAB — MAGNESIUM
Magnesium: 2.8 mg/dL — ABNORMAL HIGH (ref 1.7–2.4)
Magnesium: 2.2 mg/dL (ref 1.7–2.4)

## 2020-06-15 MED ORDER — ASPIRIN EC 81 MG PO TBEC
81.0000 mg | DELAYED_RELEASE_TABLET | Freq: Every day | ORAL | Status: DC
  Administered 2020-06-16 – 2020-06-20 (×5): 81 mg via ORAL
  Filled 2020-06-15 (×5): qty 1

## 2020-06-15 MED ORDER — DIAZEPAM 2 MG PO TABS
2.0000 mg | ORAL_TABLET | Freq: Three times a day (TID) | ORAL | Status: AC
  Administered 2020-06-15 – 2020-06-17 (×6): 2 mg via ORAL
  Filled 2020-06-15 (×6): qty 1

## 2020-06-15 MED ORDER — THIAMINE HCL 100 MG/ML IJ SOLN
Freq: Once | INTRAVENOUS | Status: AC
  Filled 2020-06-15: qty 1000

## 2020-06-15 MED ORDER — ALBUTEROL SULFATE (2.5 MG/3ML) 0.083% IN NEBU
3.0000 mL | INHALATION_SOLUTION | Freq: Four times a day (QID) | RESPIRATORY_TRACT | Status: DC
  Administered 2020-06-15 (×2): 3 mL via RESPIRATORY_TRACT
  Filled 2020-06-15 (×2): qty 3

## 2020-06-15 MED ORDER — ASPIRIN EC 81 MG PO TBEC: 81.0000 mg | DELAYED_RELEASE_TABLET | Freq: Every day | ORAL | Status: DC

## 2020-06-15 MED ORDER — CLOPIDOGREL BISULFATE 75 MG PO TABS
75.0000 mg | ORAL_TABLET | Freq: Every day | ORAL | Status: DC
  Administered 2020-06-15 – 2020-06-20 (×6): 75 mg via ORAL
  Filled 2020-06-15 (×6): qty 1

## 2020-06-15 MED ORDER — ISOSORBIDE DINITRATE 10 MG PO TABS
10.0000 mg | ORAL_TABLET | Freq: Three times a day (TID) | ORAL | Status: DC
  Administered 2020-06-15 (×2): 10 mg via ORAL
  Filled 2020-06-15 (×2): qty 1

## 2020-06-15 MED ORDER — FUROSEMIDE 10 MG/ML IJ SOLN
40.0000 mg | Freq: Two times a day (BID) | INTRAMUSCULAR | Status: DC
  Administered 2020-06-15: 40 mg via INTRAVENOUS
  Filled 2020-06-15: qty 4

## 2020-06-15 MED ORDER — KETOROLAC TROMETHAMINE 15 MG/ML IJ SOLN
7.5000 mg | Freq: Four times a day (QID) | INTRAMUSCULAR | Status: AC
  Administered 2020-06-15 – 2020-06-16 (×5): 7.5 mg via INTRAVENOUS
  Filled 2020-06-15 (×6): qty 1

## 2020-06-15 MED ORDER — INSULIN ASPART 100 UNIT/ML ~~LOC~~ SOLN
0.0000 [IU] | SUBCUTANEOUS | Status: DC
  Administered 2020-06-15 (×3): 4 [IU] via SUBCUTANEOUS
  Administered 2020-06-16 (×3): 2 [IU] via SUBCUTANEOUS
  Administered 2020-06-16: 8 [IU] via SUBCUTANEOUS
  Administered 2020-06-16: 4 [IU] via SUBCUTANEOUS
  Administered 2020-06-16 (×2): 2 [IU] via SUBCUTANEOUS
  Administered 2020-06-17: 8 [IU] via SUBCUTANEOUS
  Administered 2020-06-17 (×2): 4 [IU] via SUBCUTANEOUS
  Administered 2020-06-17: 2 [IU] via SUBCUTANEOUS
  Administered 2020-06-18: 4 [IU] via SUBCUTANEOUS
  Administered 2020-06-18: 8 [IU] via SUBCUTANEOUS
  Administered 2020-06-18: 2 [IU] via SUBCUTANEOUS

## 2020-06-15 MED ORDER — COLCHICINE 0.3 MG HALF TABLET
0.3000 mg | ORAL_TABLET | Freq: Two times a day (BID) | ORAL | Status: DC
  Administered 2020-06-15 – 2020-06-20 (×11): 0.3 mg via ORAL
  Filled 2020-06-15 (×13): qty 1

## 2020-06-15 MED FILL — Magnesium Sulfate Inj 50%: INTRAMUSCULAR | Qty: 10 | Status: AC

## 2020-06-15 MED FILL — Potassium Chloride Inj 2 mEq/ML: INTRAVENOUS | Qty: 40 | Status: AC

## 2020-06-15 MED FILL — Heparin Sodium (Porcine) Inj 1000 Unit/ML: INTRAMUSCULAR | Qty: 30 | Status: AC

## 2020-06-15 NOTE — Progress Notes (Signed)
1 Day Post-Op Procedure(s) (LRB): CORONARY ARTERY BYPASS GRAFTING (CABG) TIMES FOUR USING LIMA TO LAD;RIMA TO OM1; RADIAL ARTERY SEQUENCED DIAG TO OM2 (N/A) RADIAL ARTERY HARVEST (Right) TRANSESOPHAGEAL ECHOCARDIOGRAM (TEE) (N/A) INDOCYANINE GREEN FLUORESCENCE IMAGING (ICG) (N/A) Subjective: Incisional pain Objective: Vital signs in last 24 hours: Temp:  [96.1 F (35.6 C)-101.8 F (38.8 C)] 98.6 F (37 C) (11/25 0800) Pulse Rate:  [70-80] 80 (11/25 0800) Cardiac Rhythm: Atrial paced (11/25 0800) Resp:  [8-23] 14 (11/25 0800) BP: (102-129)/(55-68) 102/66 (11/24 2015) SpO2:  [88 %-100 %] 94 % (11/25 0800) Arterial Line BP: (82-149)/(43-67) 138/66 (11/25 0800) FiO2 (%):  [40 %-50 %] 40 % (11/24 2050) Weight:  [73.9 kg] 73.9 kg (11/25 0500)  Hemodynamic parameters for last 24 hours: PAP: (26-57)/(17-31) 51/31 CO:  [3.2 L/min-3.9 L/min] 3.8 L/min CI:  [1.8 L/min/m2-2.2 L/min/m2] 2.1 L/min/m2  Intake/Output from previous day: 11/24 0701 - 11/25 0700 In: 4351.8 [P.O.:360; I.V.:2430.7; Blood:312; IV Piggyback:1249.1] Out: 4460 [Urine:3140; Blood:600; Chest Tube:720] Intake/Output this shift: Total I/O In: 323 [I.V.:323] Out: -   General appearance: alert, cooperative and mild distress Neurologic: intact Heart: regular rate and rhythm, S1, S2 normal, no murmur, click, rub or gallop Lungs: clear to auscultation bilaterally Abdomen: soft, non-tender; bowel sounds normal; no masses,  no organomegaly Extremities: extremities normal, atraumatic, no cyanosis or edema Wound: dressed, dry  Lab Results: Recent Labs    06/14/20 2032 06/14/20 2141 06/14/20 2308 06/15/20 0332  WBC 9.4  --   --  11.7*  HGB 9.0*   < > 8.5* 8.7*  HCT 27.6*   < > 25.0* 28.0*  PLT 202  --   --  217   < > = values in this interval not displayed.   BMET:  Recent Labs    06/14/20 2032 06/14/20 2141 06/14/20 2308 06/15/20 0332  NA 144   < > 147* 142  K 4.5   < > 4.5 4.4  CL 114*  --   --  113*   CO2 21*  --   --  21*  GLUCOSE 110*  --   --  109*  BUN 11  --   --  12  CREATININE 0.96  --   --  0.94  CALCIUM 8.0*  --   --  8.2*   < > = values in this interval not displayed.    PT/INR:  Recent Labs    06/14/20 1404  LABPROT 16.1*  INR 1.3*   ABG    Component Value Date/Time   PHART 7.332 (L) 06/14/2020 2308   HCO3 22.1 06/14/2020 2308   TCO2 23 06/14/2020 2308   ACIDBASEDEF 4.0 (H) 06/14/2020 2308   O2SAT 84.0 06/14/2020 2308   CBG (last 3)  Recent Labs    06/15/20 0337 06/15/20 0514 06/15/20 0656  GLUCAP 106* 96 95    Assessment/Plan: S/P Procedure(s) (LRB): CORONARY ARTERY BYPASS GRAFTING (CABG) TIMES FOUR USING LIMA TO LAD;RIMA TO OM1; RADIAL ARTERY SEQUENCED DIAG TO OM2 (N/A) RADIAL ARTERY HARVEST (Right) TRANSESOPHAGEAL ECHOCARDIOGRAM (TEE) (N/A) INDOCYANINE GREEN FLUORESCENCE IMAGING (ICG) (N/A) Mobilize Diuresis pain control     LOS: 2 days    Linden Dolin 06/15/2020

## 2020-06-16 ENCOUNTER — Encounter (HOSPITAL_COMMUNITY): Payer: Self-pay | Admitting: Cardiothoracic Surgery

## 2020-06-16 ENCOUNTER — Inpatient Hospital Stay (HOSPITAL_COMMUNITY): Payer: BC Managed Care – PPO

## 2020-06-16 ENCOUNTER — Other Ambulatory Visit: Payer: Self-pay

## 2020-06-16 LAB — BASIC METABOLIC PANEL
Anion gap: 9 (ref 5–15)
BUN: 15 mg/dL (ref 6–20)
CO2: 23 mmol/L (ref 22–32)
Calcium: 8.6 mg/dL — ABNORMAL LOW (ref 8.9–10.3)
Chloride: 102 mmol/L (ref 98–111)
Creatinine, Ser: 1.07 mg/dL — ABNORMAL HIGH (ref 0.44–1.00)
GFR, Estimated: >60 mL/min (ref >60)
Glucose, Bld: 167 mg/dL — ABNORMAL HIGH (ref 70–99)
Potassium: 4.5 mmol/L (ref 3.5–5.1)
Sodium: 134 mmol/L — ABNORMAL LOW (ref 135–145)

## 2020-06-16 LAB — GLUCOSE, CAPILLARY
Glucose-Capillary: 149 mg/dL — ABNORMAL HIGH (ref 70–99)
Glucose-Capillary: 223 mg/dL — ABNORMAL HIGH (ref 70–99)
Glucose-Capillary: 181 mg/dL — ABNORMAL HIGH (ref 70–99)
Glucose-Capillary: 129 mg/dL — ABNORMAL HIGH (ref 70–99)
Glucose-Capillary: 151 mg/dL — ABNORMAL HIGH (ref 70–99)

## 2020-06-16 LAB — CBC
HCT: 25.8 % — ABNORMAL LOW (ref 36.0–46.0)
Hemoglobin: 8.4 g/dL — ABNORMAL LOW (ref 12.0–15.0)
MCH: 29.9 pg (ref 26.0–34.0)
MCHC: 32.6 g/dL (ref 30.0–36.0)
MCV: 91.8 fL (ref 80.0–100.0)
Platelets: 197 10*3/uL (ref 150–400)
RBC: 2.81 MIL/uL — ABNORMAL LOW (ref 3.87–5.11)
RDW: 13.5 % (ref 11.5–15.5)
WBC: 10.6 10*3/uL — ABNORMAL HIGH (ref 4.0–10.5)
nRBC: 0 % (ref 0.0–0.2)

## 2020-06-16 MED ORDER — METOCLOPRAMIDE HCL 5 MG/ML IJ SOLN
5.0000 mg | Freq: Two times a day (BID) | INTRAMUSCULAR | Status: DC
  Administered 2020-06-16 – 2020-06-20 (×9): 5 mg via INTRAVENOUS
  Filled 2020-06-16 (×9): qty 2

## 2020-06-16 MED ORDER — FUROSEMIDE 10 MG/ML IJ SOLN
40.0000 mg | Freq: Two times a day (BID) | INTRAMUSCULAR | Status: DC
  Administered 2020-06-16 – 2020-06-18 (×6): 40 mg via INTRAVENOUS
  Filled 2020-06-16 (×6): qty 4

## 2020-06-16 MED ORDER — BISACODYL 10 MG RE SUPP: 10.0000 mg | Freq: Once | RECTAL | Status: DC

## 2020-06-16 MED ORDER — ISOSORBIDE DINITRATE 10 MG PO TABS
20.0000 mg | ORAL_TABLET | Freq: Three times a day (TID) | ORAL | Status: DC
  Administered 2020-06-16 – 2020-06-20 (×13): 20 mg via ORAL
  Filled 2020-06-16 (×13): qty 2

## 2020-06-16 MED ORDER — ALBUTEROL SULFATE (2.5 MG/3ML) 0.083% IN NEBU: 3.0000 mL | INHALATION_SOLUTION | RESPIRATORY_TRACT | Status: DC | PRN

## 2020-06-16 NOTE — Op Note (Signed)
CARDIOTHORACIC SURGERY OPERATIVE NOTE  Date of Procedure: 06/14/2020 Preoperative Diagnosis: Severe 3-vessel Coronary Artery Disease  Postoperative Diagnosis: Same  Procedure:    Coronary Artery Bypass Grafting x 4  Left Internal Mammary Artery to Distal Left Anterior Descending Coronary Artery; right radial artery Graft to D1 and OM2 as sequenced graft; pedicled RIMA to OM1;  Open right radial artery harvesting Bilateral IMA harvesting Completion graft surveillance with indocyanine green fluorescence imaging  Surgeon: B. Lorayne Marek, MD  Assistant: Jacques Earthly PA-C  Anesthesia: get  Operative Findings:  preserved left ventricular systolic function  good quality  internal mammary artery conduits  good quality radial artery conduit  good quality target vessels for grafting    BRIEF CLINICAL NOTE AND INDICATIONS FOR SURGERY  54 yo lady was being evaluated for cholecystectomy; stress test was performed which was +. LHC was performed which showed severe 3V CAD. Presents for CABG.    DETAILS OF THE OPERATIVE PROCEDURE  Preparation:  The patient is brought to the operating room on the above mentioned date and central monitoring was established by the anesthesia team including placement of Swan-Ganz catheter and radial arterial line. The patient is placed in the supine position on the operating table.  Intravenous antibiotics are administered. General endotracheal anesthesia is induced uneventfully. A Foley catheter is placed.  Baseline transesophageal echocardiogram was performed.  Findings were notable for preserved LV function.   The patient's chest, abdomen, both groins, the right upper extremity, and both lower extremities are prepared and draped in a sterile manner. A time out procedure is performed.   Surgical Approach and Conduit Harvest:  Attention is 1st turned to right radial artery harvesting, which is done by open technique using harmonic scalpel. After  removal, the forearm incision is closed in layers and the arm is tucked at the side. Next,  median sternotomy incision was performed and the left internal mammary artery is dissected from the chest wall and prepared for bypass grafting. The left internal mammary artery is notably good quality conduit. Attention is then turned to the right chest wall where the RIMA is harvested in standard fashion.  Following systemic heparinization, the  internal mammary artery grafts were transected distally noted to have excellent flow. Both pedicles were treated with papaverine solution.   Extracorporeal Cardiopulmonary Bypass and Myocardial Protection:  The pericardium is opened. The ascending aorta is nondiseased in appearance. The ascending aorta and the right atrium are cannulated for cardiopulmonary bypass.  Adequate heparinization is verified.     The entire pre-bypass portion of the operation was notable for stable hemodynamics.  Cardiopulmonary bypass was begun and the surface of the heart is inspected. Distal target vessels are selected for coronary artery bypass grafting. A cardioplegia cannula is placed in the ascending aorta.  A temperature probe was placed in the interventricular septum.  The patient is allowed to cool passively to 34C systemic temperature.  The aortic cross clamp is applied and cold blood cardioplegia is delivered initially in an antegrade fashion through the aortic root.  The initial cardioplegic arrest is rapid with early diastolic arrest.  Repeat doses of cardioplegia are administered intermittently throughout the entire cross clamp portion of the operation through the aortic root and through subsequently placed  grafts in order to maintain completely flat electrocardiogram.   Coronary Artery Bypass Grafting:     The 1st obtuse marginal branch of the left circumflex coronary artery was grafted using the pedicle RIMA graft which was routed underneath the aorta via the  transverse sinus. The anastomosis was performed in an end-to-side fashion.  At the site of distal anastomosis the target vessel was good quality and measured approximately 1.73mm in diameter. Anastomotic patency and runoff was confirmed with indocyanine green fluorescence imaging (SPY).  The 1st diagonal branch of the left anterior descending coronary artery and the distal OM vessel were grafted using the right radial artery graft in a sequenced fashion.  At the site of distal anastomoses the target vessels were good quality and measured approximately 1.5 mm in diameter. Anastomotic patency and runoff was confirmed with indocyanine green fluorescence imaging (SPY).    The distal left anterior coronary artery was grafted with the left internal mammary artery in an end-to-side fashion.  At the site of distal anastomosis the target vessel was good quality and measured approximately 1.5  mm in diameter. Anastomotic patency and runoff was confirmed with indocyanine green fluorescence imaging (SPY).  The proximal radial artery graft anastomosis was placed directly to the ascending aorta prior to removal of the aortic cross clamp.  Deairing procedures were performed, and the aortic cross clamp was removed.    Procedure Completion:  All proximal and distal coronary anastomoses were inspected for hemostasis and appropriate graft orientation. Epicardial pacing wires are fixed to the right ventricular outflow tract and to the right atrial appendage. The patient is rewarmed to 37C temperature. The patient is weaned and disconnected from cardiopulmonary bypass.  The patient's rhythm at separation from bypass was  Sinus bradycardia  The patient was weaned from cardiopulmonary bypass without any inotropic support.   Followup transesophageal echocardiogram performed after separation from bypass revealed  no changes from the preoperative exam.  The aortic and venous cannula were removed uneventfully. Protamine was  administered to reverse the anticoagulation. The mediastinum and pleural space were inspected for hemostasis and irrigated with saline solution. The mediastinum and both pleural spaces were drained using fluted chest tubes placed through separate stab incisions inferiorly.  The soft tissues anterior to the aorta were reapproximated loosely. The sternum is closed with double strength sternal wire. The soft tissues anterior to the sternum were closed in multiple layers and the skin is closed with a running subcuticular skin closure.    Disposition:  The patient tolerated the procedure well and is transported to the surgical intensive care in stable condition. There are no intraoperative complications. All sponge instrument and needle counts are verified correct at completion of the operation.    Cynthia Fling, MD 06/16/2020 2:48 PM

## 2020-06-16 NOTE — Discharge Summary (Signed)
Physician Discharge Summary       [address]       [place] [address]             [phone]    Patient ID: Cynthia Hale MRN: [id number] DOB/AGE: [birth year] 56 y.o.  Admit date: 06/12/2020 Discharge date: 06/20/2020  Admission Diagnoses: 1. Acute coronary syndrome (HCC) 2. Coronary artery disease  Discharge Diagnoses:  1. S/P CABG x 4 2. Expected post op blood loss anemia 3. Right internal carotid artery stenosis 4. History of Hyperlipidemia 5. History of Diabetes mellitus without complication (HCC) 6. History of Hypertension 7. History of Vitamin D deficiency  Consults: None  Procedure (s):  TRANSESOPHAGEAL ECHOCARDIOGRAM (TEE), CORONARY ARTERY BYPASS GRAFTING (CABG)x 4 (LIMA to LAD, RIMA to OM1, LEFT RADIAL ARTERY SEQUENTIALLY to DIAGONAL and OM2) with bilateral IMAs and RIGHT RADIAL ARTERY HARVEST by Dr. Vickey Sages on 06/14/2020.  History of Presenting Illness: This is a 54 yo lady with h/o DM, HL, HTN and CAD s/p PCI 2 years ago was being evaluated for upper abdominal pain. She was discovered to have cholelithiasis. In preparation for cholecystectomy, she underwent stress test which was +. This lead to [name] on 11/23 which shows severe multivessel CAD not amenable to PCI. Referral made for CABG. She is asx at present. Pre operative carotid duplex US showed no significant left internal carotid artery stenosis and a 80-99% right internal carotid artery stenosis (right carotid bifurcation is above hyoid and narrowing extends from right carotid bifurcation through mid right ICA). Vascular surgery was consulted. Since she is asymptomatic and the duplex indicated a high bifurcation, we may need a CTA of the neck to obtain more information.  This would help [place] decide if she needs a carotid endarterectomy verses a TCAR. Patient to be worked up after she recovers from coronary artery bypass grafting surgery. Patient underwent a CABG x 4on 06/14/2020.  Brief Hospital  Course:   HPI: This is a 54 yo lady with h/o DM, HL, HTN and CAD s/p PCI 2 years ago was being evaluated for upper abdominal pain. She was discovered to have cholelithiasis. In preparation for cholecystectomy, she underwent stress test which was +. This lead to [name] on 11/23 which shows severe multivessel CAD not amenable to PCI. Referral made for CABG. She is asx at present. Pre operative carotid duplex US showed no significant left internal carotid artery stenosis and a 80-99% right internal carotid artery stenosis (right carotid bifurcation is above hyoid and narrowing extends from right carotid bifurcation through mid right ICA). Vascular surgery was consulted. Since she is asymptomatic and the duplex indicated a high bifurcation, we may need a CTA of the neck to obtain more information.  This would help [place] decide if she needs a carotid endarterectomy verses a TCAR. Patient to be worked up after she recovers from coronary artery bypass grafting surgery. Patient underwent a CABG x 4on 06/14/2020.  Hospital Course: Patient was extubated late the evening of surgery. She was initially AAI paced. She was weaned off Neo Synephrine drip. [name], a line, and foley were removed early in her post operative course. Chest tubes remained for several days then were removed. She was started on Lopressor ane Imdur (for left radial artery harvest). She was volume overloaded and diuresed. She was started on Plavix and ec asa was decreased to 81 mg daily for ACS. She was weaned off the Insulin drip. She will be restarted on Metformin closer to discharge. Her pre op  HGA1C was 9.5. She will need close medical follow up after discharge regarding further diabetes management and surveillance of HGA1C. She was felt surgically stable for transfer from the ICU to the progressive care unit on 06/18/2020.  She was evaluated by PT/OT who recommended home health PT/OT.  They also recommended home equipment.  This has been arranged.  She continued to require home oxygen which was being weaned as tolerated. Her pacer was turned off 11/29 and her EPW were removed on 11/30.  She went for a CT scan of the neck on 11/29 and it showed The stenosis on the right is only 60% approximately. Dr. Edilia Bo continued to follow along and provid recommendations.She was deemed ready for discharge.      Latest Vital Signs: Blood pressure 133/70, pulse 70, temperature 98.7 F (37.1 C), temperature source Oral, resp. rate (!) 31, height 5\' 3"  (1.6 m), weight 75.6 kg, SpO2 93 %.  Physical Exam:  General appearance: alert, cooperative and no distress Heart: regular rate and rhythm, S1, S2 normal, no murmur, click, rub or gallop Lungs: clear to auscultation bilaterally Abdomen: soft, non-tender; bowel sounds normal; no masses,  no organomegaly Extremities: extremities normal, atraumatic, no cyanosis or edema Wound: clean and dry, staples in place  Discharge Condition:Stable and discharged to home.  Recent laboratory studies:  Lab Results  Component Value Date   WBC 8.6 06/19/2020   HGB 10.8 (L) 06/19/2020   HCT 31.7 (L) 06/19/2020   MCV 89.3 06/19/2020   PLT 400 06/19/2020   Lab Results  Component Value Date   NA 137 06/19/2020   K 3.9 06/19/2020   CL 96 (L) 06/19/2020   CO2 28 06/19/2020   CREATININE 1.02 (H) 06/19/2020   GLUCOSE 170 (H) 06/19/2020      Diagnostic Studies: CT ANGIO HEAD W OR WO CONTRAST  Addendum Date: 06/19/2020   ADDENDUM REPORT: 06/19/2020 10:16 ADDENDUM: CONTRAST: 75 mL of Omnipaque 350 IV. Electronically Signed   By: Feliberto Harts MD   On: 06/19/2020 10:16   Result Date: 06/19/2020 CLINICAL DATA:  Carotid stenosis screening. EXAM: CT ANGIOGRAPHY HEAD AND NECK TECHNIQUE: Multidetector CT imaging of the head and neck was performed using the standard protocol during bolus administration of intravenous contrast. Multiplanar CT image reconstructions and MIPs were obtained to evaluate the vascular  anatomy. Carotid stenosis measurements (when applicable) are obtained utilizing NASCET criteria, using the distal internal carotid diameter as the denominator. CONTRAST:  75mL OMNIPAQUE IOHEXOL 350 MG/ML SOLN Contrast dose/type will be added as an addendum. COMPARISON:  None. FINDINGS: CT HEAD FINDINGS Brain: No evidence of acute infarction, hemorrhage, hydrocephalus, extra-axial collection or mass lesion/mass effect. Patchy white matter hypoattenuation, most likely the sequela of chronic microvascular ischemic disease. Vascular: Calcific atherosclerosis. Skull: No acute fracture. Sinuses: Visualized sinuses are clear. Orbits: No acute findings. Review of the MIP images confirms the above findings CTA NECK FINDINGS Aortic arch: Great vessel origins are patent. Right carotid system: Atherosclerosis at the carotid bifurcation with approximately 55% narrowing of the proximal internal carotid artery. Left carotid system: Atherosclerosis at the carotid bifurcation with approximately 50% narrowing of the proximal internal carotid artery. Vertebral arteries: Codominant. Critical stenosis of the left vertebral artery origin. The remainder of the vertebral artery is opacified in the neck. No evidence of hemodynamically significant stenosis of the right vertebral artery. Skeleton: No acute fracture. Other neck: No mass or suspicious adenopathy. Upper chest: Mediastinal gas and fluid, likely postsurgical given recent CABG. Linear and scattered ground-glass opacities the  visualized left greater than right lungs. Review of the MIP images confirms the above findings CTA HEAD FINDINGS Evaluation is limited by venous contamination. Within this limitation: Anterior circulation: Bilateral internal carotid arteries are patent. Atherosclerosis of the right greater than left cavernous carotid arteries without evidence of greater than 50% narrowing. MCAs and ACAs are patent with mild multifocal atherosclerotic narrowing. Hypoplastic  right A1 ACA with compensatory dominant left A1 ACA. No evidence of proximal hemodynamically significant stenosis or large vessel occlusion. There is a small (1-2 mm) inferiorly directed infundibulum versus aneurysm involving the paraclinoid left ICA (see series 16, image 105 and series 12, image 90) Posterior circulation: Bilateral intradural vertebral arteries are patent. Suspected small fenestration of the basilar artery. The basilar artery is patent. Right fetal like PCA with the right PCA being supplied by small right posterior communicating artery and right P1 PCA. There is multifocal moderate narrowing of the left P2 and P3 PCA. Venous sinuses: As permitted by contrast timing, patent. Anatomic variants: Right fetal like PCA. Suspected fenestrated basilar artery proximally. Review of the MIP images confirms the above findings IMPRESSION: 1. No evidence of acute intracranial abnormality. 2. Critical stenosis of the left vertebral artery origin, with the remainder of the vertebral artery opacified in the neck. 3. Multifocal moderate stenosis of the left P2 and P3 PCA. Otherwise, multifocal mild atherosclerotic narrowing intracranially without large vessel occlusion. 4. Bilateral carotid bifurcation atherosclerosis with narrowing of bilateral proximal internal carotid arteries, approximately 55% on the right and 50% on the left. 5. Small (1-2 mm) inferiorly directed infundibulum versus aneurysm involving the paraclinoid left ICA. 6. Postsurgical changes in the partially imaged mediastinum. Linear and scattered ground-glass opacities the visualized left greater than right lungs, favored to reflect atelectasis in the postoperative setting. Edema and/or atypical infection is not excluded. Electronically Signed: By: Feliberto Harts MD On: 06/19/2020 10:04   DG Chest 2 View  Result Date: 06/18/2020 CLINICAL DATA:  History of open heart surgery. EXAM: CHEST - 2 VIEW COMPARISON:  November 27, 21. FINDINGS:  Similar enlarged cardiac silhouette, accentuated by low lung volumes. Similar low lung volumes with linear bibasilar opacities, compatible with atelectasis. No visible pleural effusions or pneumothorax. Similar positioning of a right IJ central venous catheter. Interval removal of left chest tube and mediastinal drain. IMPRESSION: Similar low lung volumes with bibasilar atelectasis. Electronically Signed   By: Feliberto Harts MD   On: 06/18/2020 06:47   DG Chest 2 View  Result Date: 06/13/2020 CLINICAL DATA:  CABG, hypertension diabetes EXAM: CHEST - 2 VIEW COMPARISON:  06/06/2020 FINDINGS: The heart size and mediastinal contours are within normal limits. Both lungs are clear. The visualized skeletal structures are unremarkable. IMPRESSION: No active cardiopulmonary disease. Electronically Signed   By: Jasmine Pang M.D.   On: 06/13/2020 22:12   CT ANGIO NECK W OR WO CONTRAST  Addendum Date: 06/19/2020   ADDENDUM REPORT: 06/19/2020 10:16 ADDENDUM: CONTRAST: 75 mL of Omnipaque 350 IV. Electronically Signed   By: Feliberto Harts MD   On: 06/19/2020 10:16   Result Date: 06/19/2020 CLINICAL DATA:  Carotid stenosis screening. EXAM: CT ANGIOGRAPHY HEAD AND NECK TECHNIQUE: Multidetector CT imaging of the head and neck was performed using the standard protocol during bolus administration of intravenous contrast. Multiplanar CT image reconstructions and MIPs were obtained to evaluate the vascular anatomy. Carotid stenosis measurements (when applicable) are obtained utilizing NASCET criteria, using the distal internal carotid diameter as the denominator. CONTRAST:  55mL OMNIPAQUE IOHEXOL 350 MG/ML SOLN  Contrast dose/type will be added as an addendum. COMPARISON:  None. FINDINGS: CT HEAD FINDINGS Brain: No evidence of acute infarction, hemorrhage, hydrocephalus, extra-axial collection or mass lesion/mass effect. Patchy white matter hypoattenuation, most likely the sequela of chronic microvascular ischemic  disease. Vascular: Calcific atherosclerosis. Skull: No acute fracture. Sinuses: Visualized sinuses are clear. Orbits: No acute findings. Review of the MIP images confirms the above findings CTA NECK FINDINGS Aortic arch: Great vessel origins are patent. Right carotid system: Atherosclerosis at the carotid bifurcation with approximately 55% narrowing of the proximal internal carotid artery. Left carotid system: Atherosclerosis at the carotid bifurcation with approximately 50% narrowing of the proximal internal carotid artery. Vertebral arteries: Codominant. Critical stenosis of the left vertebral artery origin. The remainder of the vertebral artery is opacified in the neck. No evidence of hemodynamically significant stenosis of the right vertebral artery. Skeleton: No acute fracture. Other neck: No mass or suspicious adenopathy. Upper chest: Mediastinal gas and fluid, likely postsurgical given recent CABG. Linear and scattered ground-glass opacities the visualized left greater than right lungs. Review of the MIP images confirms the above findings CTA HEAD FINDINGS Evaluation is limited by venous contamination. Within this limitation: Anterior circulation: Bilateral internal carotid arteries are patent. Atherosclerosis of the right greater than left cavernous carotid arteries without evidence of greater than 50% narrowing. MCAs and ACAs are patent with mild multifocal atherosclerotic narrowing. Hypoplastic right A1 ACA with compensatory dominant left A1 ACA. No evidence of proximal hemodynamically significant stenosis or large vessel occlusion. There is a small (1-2 mm) inferiorly directed infundibulum versus aneurysm involving the paraclinoid left ICA (see series 16, image 105 and series 12, image 90) Posterior circulation: Bilateral intradural vertebral arteries are patent. Suspected small fenestration of the basilar artery. The basilar artery is patent. Right fetal like PCA with the right PCA being supplied by  small right posterior communicating artery and right P1 PCA. There is multifocal moderate narrowing of the left P2 and P3 PCA. Venous sinuses: As permitted by contrast timing, patent. Anatomic variants: Right fetal like PCA. Suspected fenestrated basilar artery proximally. Review of the MIP images confirms the above findings IMPRESSION: 1. No evidence of acute intracranial abnormality. 2. Critical stenosis of the left vertebral artery origin, with the remainder of the vertebral artery opacified in the neck. 3. Multifocal moderate stenosis of the left P2 and P3 PCA. Otherwise, multifocal mild atherosclerotic narrowing intracranially without large vessel occlusion. 4. Bilateral carotid bifurcation atherosclerosis with narrowing of bilateral proximal internal carotid arteries, approximately 55% on the right and 50% on the left. 5. Small (1-2 mm) inferiorly directed infundibulum versus aneurysm involving the paraclinoid left ICA. 6. Postsurgical changes in the partially imaged mediastinum. Linear and scattered ground-glass opacities the visualized left greater than right lungs, favored to reflect atelectasis in the postoperative setting. Edema and/or atypical infection is not excluded. Electronically Signed: By: Feliberto Harts MD On: 06/19/2020 10:04   CARDIAC CATHETERIZATION  Result Date: 06/13/2020  Prox Cx to Mid Cx lesion is 80% stenosed.  Ost LAD to Prox LAD lesion is 20% stenosed.  Dist LAD lesion is 80% stenosed.  Mid LAD lesion is 40% stenosed.  2nd Diag lesion is 90% stenosed.  Prox LAD lesion is 30% stenosed.  Mid Cx to Dist Cx lesion is 70% stenosed.  3rd Mrg lesion is 95% stenosed.  CVTS consult. Staged angioplasty of LCX stent and LAD if surgery is declined.   DG Chest Port 1 View  Result Date: 06/17/2020 CLINICAL DATA:  Postoperative open heart  surgery EXAM: PORTABLE CHEST 1 VIEW COMPARISON:  06/16/2020 FINDINGS: Postoperative changes in the mediastinum. Appliances are unchanged in  position. Shallow inspiration with basilar atelectasis. Cardiac enlargement. Probable vascular congestion with mild perihilar infiltration. No pleural effusions. No pneumothorax. Mediastinal contours appear intact. IMPRESSION: Shallow inspiration with basilar atelectasis. Cardiac enlargement with mild vascular congestion and perihilar edema. No change. Electronically Signed   By: Burman Nieves M.D.   On: 06/17/2020 06:27   DG Chest Port 1 View  Result Date: 06/16/2020 CLINICAL DATA:  History of open heart surgery EXAM: PORTABLE CHEST 1 VIEW COMPARISON:  06/15/2020 FINDINGS: Postoperative changes in the mediastinum with sternotomy wires and surgical clips. Skin clips along the midline consistent with recent surgery. The left Swan-Ganz catheter has been converted to a central line with tip over the low SVC region. Bilateral chest tubes and mediastinal drains are unchanged in position. Diffuse cardiac enlargement with perihilar infiltration or edema. Atelectasis in the lung bases. No pleural effusions. No pneumothorax. IMPRESSION: Cardiac enlargement with bilateral perihilar infiltration or edema. Electronically Signed   By: Burman Nieves M.D.   On: 06/16/2020 03:55   DG Chest Port 1 View  Result Date: 06/15/2020 CLINICAL DATA:  Evaluate chest tube placement EXAM: PORTABLE CHEST 1 VIEW COMPARISON:  06/14/2020 FINDINGS: Status post median sternotomy and CABG procedure. ET tube and NG tube have been removed. Bilateral chest tubes and mediastinal drain are in place. No pneumothorax identified. Stable position of right IJ Swan-Ganz catheter with tip in the proximal right pulmonary artery. Lung volumes are low. Bibasilar atelectasis. No pleural effusion or pulmonary edema. IMPRESSION: 1. Interval extubation and removal of NG tube. 2. Bilateral chest tubes in place with no pneumothorax. 3. Decreased lung volumes with bibasilar atelectasis. Electronically Signed   By: Signa Kell M.D.   On: 06/15/2020 08:32    DG Chest Port 1 View  Result Date: 06/14/2020 CLINICAL DATA:  Status post bypass surgery. EXAM: PORTABLE CHEST 1 VIEW COMPARISON:  Chest x-ray 06/13/2020 FINDINGS: The endotracheal tube is 6 mm above the carina and should be retracted approximately 3 cm. The NG tube is coursing down the esophagus. The tip is in the distal esophagus and the proximal port is in the mid esophagus. This should be advanced several cm. The right IJ Swan-Ganz catheter tip is in good position in the proximal right pulmonary artery. Bilateral chest tubes in good position. No pneumothorax is identified. Low lung volumes with vascular crowding and streaky atelectasis but no edema or effusions. IMPRESSION: 1. The endotracheal tube is 6 mm above the carina and should be retracted approximately 3 cm. 2. The NG tube tip is in the distal esophagus and should be advanced several cm. 3. Bilateral chest tubes in good position without pneumothorax. Electronically Signed   By: Rudie Meyer M.D.   On: 06/14/2020 14:40   ECHOCARDIOGRAM COMPLETE  Result Date: 06/13/2020    ECHOCARDIOGRAM REPORT   Patient Name:   CYNTHYA YAM Date of Exam: 06/13/2020 Medical Rec #:  [id number]      Height:       63.0 in Accession #:    [id number]     Weight:       166.0 lb Date of Birth:  Dec 12, 1965      BSA:          1.786 m Patient Age:    54 years       BP:           102/60 mmHg Patient Gender: F  HR:           50 bpm. Exam Location:  Inpatient Procedure: 2D Echo, Color Doppler, Cardiac Doppler and Intracardiac            Opacification Agent Indications:    Acute Cornonary Syndrome I24.9  History:        Patient has no prior history of Echocardiogram examinations.                 Risk Factors:Hypertension and Diabetes.  Sonographer:    Eulah Pont RDCS Referring Phys: [id number] AJAY KADAKIA IMPRESSIONS  1. Left ventricular ejection fraction, by estimation, is 55 to 60%. The left ventricle has normal function. The left ventricle demonstrates  regional wall motion abnormalities (see scoring diagram/findings for description). Left ventricular diastolic parameters are consistent with Grade II diastolic dysfunction (pseudonormalization). There is mild hypokinesis of the left ventricular, apical inferior wall.  2. Right ventricular systolic function is mildly reduced. The right ventricular size is normal. There is normal pulmonary artery systolic pressure.  3. Left atrial size was mildly dilated.  4. The mitral valve is degenerative. No evidence of mitral valve regurgitation.  5. The aortic valve is tricuspid. There is mild calcification of the aortic valve. There is mild thickening of the aortic valve. Aortic valve regurgitation is not visualized. Mild to moderate aortic valve sclerosis/calcification is present, without any evidence of aortic stenosis.  6. There is mild (Grade II) atheroma plaque involving the aortic root and ascending aorta.  7. The inferior vena cava is normal in size with greater than 50% respiratory variability, suggesting right atrial pressure of 3 mmHg. FINDINGS  Left Ventricle: Left ventricular ejection fraction, by estimation, is 55 to 60%. The left ventricle has normal function. The left ventricle demonstrates regional wall motion abnormalities. Mild hypokinesis of the left ventricular, apical inferior wall. Definity contrast agent was given IV to delineate the left ventricular endocardial borders. The left ventricular internal cavity size was normal in size. There is no left ventricular hypertrophy. Left ventricular diastolic parameters are consistent with Grade II diastolic dysfunction (pseudonormalization).  LV Wall Scoring: The apical inferior segment and apex are hypokinetic. The entire anterior wall, entire lateral wall, entire septum, and inferior wall are normal. Right Ventricle: The right ventricular size is normal. No increase in right ventricular wall thickness. Right ventricular systolic function is mildly reduced. There  is normal pulmonary artery systolic pressure. The tricuspid regurgitant velocity is 2.41 m/s, and with an assumed right atrial pressure of 3 mmHg, the estimated right ventricular systolic pressure is 26.2 mmHg. Left Atrium: Left atrial size was mildly dilated. Right Atrium: Right atrial size was normal in size. Pericardium: There is no evidence of pericardial effusion. Mitral Valve: The mitral valve is degenerative in appearance. There is mild thickening of the mitral valve leaflet(s). There is mild calcification of the mitral valve leaflet(s). Normal mobility of the mitral valve leaflets. Mild mitral annular calcification. No evidence of mitral valve regurgitation. Tricuspid Valve: The tricuspid valve is normal in structure. Tricuspid valve regurgitation is trivial. Aortic Valve: The aortic valve is tricuspid. There is mild calcification of the aortic valve. There is mild thickening of the aortic valve. There is mild aortic valve annular calcification. Aortic valve regurgitation is not visualized. Mild to moderate aortic valve sclerosis/calcification is present, without any evidence of aortic stenosis. Pulmonic Valve: The pulmonic valve was normal in structure. Pulmonic valve regurgitation is not visualized. Aorta: The aortic root is normal in size and structure. There is mild (Grade  II) atheroma plaque involving the aortic root and ascending aorta. Venous: The inferior vena cava is normal in size with greater than 50% respiratory variability, suggesting right atrial pressure of 3 mmHg. IAS/Shunts: The interatrial septum was not assessed.  LEFT VENTRICLE PLAX 2D LVIDd:         4.10 cm  Diastology LVIDs:         2.40 cm  LV e' medial:    6.96 cm/s LV PW:         0.80 cm  LV E/e' medial:  11.8 LV IVS:        0.80 cm  LV e' lateral:   9.57 cm/s LVOT diam:     1.70 cm  LV E/e' lateral: 8.6 LV SV:         54 LV SV Index:   30 LVOT Area:     2.27 cm  RIGHT VENTRICLE RV S prime:     9.03 cm/s TAPSE (M-mode): 2.4 cm LEFT  ATRIUM             Index       RIGHT ATRIUM           Index LA diam:        3.30 cm 1.85 cm/m  RA Area:     13.50 cm LA Vol (A2C):   39.0 ml 21.83 ml/m RA Volume:   28.50 ml  15.95 ml/m LA Vol (A4C):   36.9 ml 20.66 ml/m LA Biplane Vol: 38.5 ml 21.55 ml/m  AORTIC VALVE LVOT Vmax:   99.20 cm/s LVOT Vmean:  72.500 cm/s LVOT VTI:    0.237 m  AORTA Ao Root diam: 2.60 cm Ao Asc diam:  2.60 cm MITRAL VALVE               TRICUSPID VALVE MV Area (PHT): 3.65 cm    TR Peak grad:   23.2 mmHg MV Decel Time: 208 msec    TR Vmax:        241.00 cm/s MV E velocity: 82.30 cm/s MV A velocity: 50.10 cm/s  SHUNTS MV E/A ratio:  1.64        Systemic VTI:  0.24 m                            Systemic Diam: 1.70 cm [name] MD Electronically signed by [name] MD Signature Date/Time: 06/13/2020/7:44:52 PM    Final    ECHO INTRAOPERATIVE TEE  Result Date: 06/14/2020  *INTRAOPERATIVE TRANSESOPHAGEAL REPORT *  Patient Name:   Heide Spark Date of Exam: 06/14/2020 Medical Rec #:  [id number]      Height:       63.0 in Accession #:    [id number]     Weight:       164.4 lb Date of Birth:  [birth year]      BSA:          1.78 m Patient Age:    54 years       BP:           147/72 mmHg Patient Gender: F              HR:           67 bpm. Exam Location:  Anesthesiology Transesophogeal exam was perform intraoperatively during surgical procedure. Patient was closely monitored under general anesthesia during the entirety of examination. Indications:     CAD Sonographer:     Lavenia Atlas  RDCS Performing Phys: [id number] Merri Brunette ATKINS Diagnosing Phys: Gaynelle Adu MD Complications: No known complications during this procedure. POST-OP IMPRESSIONS Overall, there were no significant changes from pre-bypass. PRE-OP FINDINGS  Left Ventricle: The left ventricle has normal systolic function, with an ejection fraction of 55-60%. The cavity size was normal. There is no increase in left ventricular wall thickness. Right Ventricle: The  right ventricle has normal systolic function. The cavity was normal. There is no increase in right ventricular wall thickness. Left Atrium: Left atrial size was not assessed. The left atrial appendage is well visualized and there is no evidence of thrombus present. Right Atrium: Right atrial size was not assessed. Interatrial Septum: No atrial level shunt detected by color flow Doppler. Pericardium: There is no evidence of pericardial effusion. Mitral Valve: The mitral valve is normal in structure. Mitral valve regurgitation is not visualized by color flow Doppler. Tricuspid Valve: The tricuspid valve was normal in structure. Tricuspid valve regurgitation was not visualized by color flow Doppler. Aortic Valve: The aortic valve is normal in structure. There is mild thickening of the aortic valve and There is mild calcification of the aortic valve Aortic valve regurgitation was not visualized by color flow Doppler. Pulmonic Valve: The pulmonic valve was normal in structure. Pulmonic valve regurgitation is not visualized by color flow Doppler.  Gaynelle Adu MD Electronically signed by Gaynelle Adu MD Signature Date/Time: 06/14/2020/2:03:58 PM    Final    VAS US DOPPLER PRE CABG  Result Date: 06/13/2020 PREOPERATIVE VASCULAR EVALUATION  Indications:      Pre-CABG. Risk Factors:     Hypertension, Diabetes. Comparison Study: No prior study on file Performing Technologist: Gertie Fey MHA, RDMS, RVT, RDCS  Examination Guidelines: A complete evaluation includes B-mode imaging, spectral Doppler, color Doppler, and power Doppler as needed of all accessible portions of each vessel. Bilateral testing is considered an integral part of a complete examination. Limited examinations for reoccurring indications may be performed as noted.  Right Carotid Findings: +----------+--------+--------+--------+-----------------------+--------+           PSV cm/sEDV cm/sStenosisDescribe               Comments  +----------+--------+--------+--------+-----------------------+--------+ CCA Prox  76      17                                              +----------+--------+--------+--------+-----------------------+--------+ CCA Distal59      20              smooth and heterogenous         +----------+--------+--------+--------+-----------------------+--------+ ICA Prox  312     122     80-99%  smooth and heterogenous         +----------+--------+--------+--------+-----------------------+--------+ ICA Mid   165     52                                              +----------+--------+--------+--------+-----------------------+--------+ ICA Distal187     44                                              +----------+--------+--------+--------+-----------------------+--------+ ECA  95      11                                              +----------+--------+--------+--------+-----------------------+--------+ Portions of this table do not appear on this page. +----------+--------+-------+----------------+------------+           PSV cm/sEDV cmsDescribe        Arm Pressure +----------+--------+-------+----------------+------------+ Subclavian132            Multiphasic, WNL             +----------+--------+-------+----------------+------------+ +---------+--------+--+--------+--+---------+ VertebralPSV cm/s75EDV cm/s24Antegrade +---------+--------+--+--------+--+---------+ Left Carotid Findings: +----------+--------+--------+--------+-----------------------+--------+           PSV cm/sEDV cm/sStenosisDescribe               Comments +----------+--------+--------+--------+-----------------------+--------+ CCA Prox  120     32                                              +----------+--------+--------+--------+-----------------------+--------+ CCA Distal101     24              smooth and heterogenous          +----------+--------+--------+--------+-----------------------+--------+ ICA Prox  129     38                                              +----------+--------+--------+--------+-----------------------+--------+ ICA Distal117     40                                              +----------+--------+--------+--------+-----------------------+--------+ ECA       101     14                                              +----------+--------+--------+--------+-----------------------+--------+ +----------+--------+--------+----------------+------------+ SubclavianPSV cm/sEDV cm/sDescribe        Arm Pressure +----------+--------+--------+----------------+------------+           220             Multiphasic, WNL             +----------+--------+--------+----------------+------------+ +---------+--------+--+--------+-+---------+ VertebralPSV cm/s15EDV cm/s6Antegrade +---------+--------+--+--------+-+---------+  ABI Findings: +--------+------------------+-----+---------+--------+ Right   Rt Pressure (mmHg)IndexWaveform Comment  +--------+------------------+-----+---------+--------+ [id number]                    triphasic         +--------+------------------+-----+---------+--------+ +--------+------------------+-----+---------+-------+ Left    Lt Pressure (mmHg)IndexWaveform Comment +--------+------------------+-----+---------+-------+ Brachial120                    triphasic        +--------+------------------+-----+---------+-------+  Right Doppler Findings: +-----------+--------+-----+---------+--------------------+ Site       PressureIndexDoppler  Comments             +-----------+--------+-----+---------+--------------------+ Brachial   131          triphasic                     +-----------+--------+-----+---------+--------------------+  Radial                  triphasic                      +-----------+--------+-----+---------+--------------------+ Ulnar                   biphasic                      +-----------+--------+-----+---------+--------------------+ Palmar Arch                      Within normal limits +-----------+--------+-----+---------+--------------------+  Left Doppler Findings: +-----------+--------+-----+---------+--------------------+ Site       PressureIndexDoppler  Comments             +-----------+--------+-----+---------+--------------------+ Brachial   120          triphasic                     +-----------+--------+-----+---------+--------------------+ Radial                  triphasic                     +-----------+--------+-----+---------+--------------------+ Ulnar                   triphasic                     +-----------+--------+-----+---------+--------------------+ Palmar Arch                      Within normal limits +-----------+--------+-----+---------+--------------------+  Summary: Right Carotid: Velocities in the right ICA are consistent with a 80-99%                stenosis. Pre-surgical evaluation: Right carotid bifurcation is above hyoid. Narrowing extends from right carotid bifurcation through mid right ICA. Distal right ICA anatomy is normal. Left Carotid: Velocities in the left ICA are consistent with a 1-39% stenosis. Vertebrals:  Bilateral vertebral arteries demonstrate antegrade flow. Subclavians: Normal flow hemodynamics were seen in bilateral subclavian              arteries. Right Upper Extremity: No significant arterial obstruction detected in the right upper extremity. Doppler waveforms remain within normal limits with right radial compression. Doppler waveforms remain within normal limits with right ulnar compression. Left Upper Extremity: No significant arterial obstruction detected in the left upper extremity. Doppler waveforms remain within normal limits with left radial compression. Doppler  waveforms remain within normal limits with left ulnar compression.  Electronically signed by Waverly Ferrarihristopher Dickson MD on 06/13/2020 at 3:14:04 PM.    Final    Discharge Instructions    Amb Referral to Cardiac Rehabilitation   Complete by: As directed    Diagnosis: CABG   CABG X ___: 4   After initial evaluation and assessments completed: Virtual Based Care may be provided alone or in conjunction with Phase 2 Cardiac Rehab based on patient barriers.: Yes      Discharge Medications: Allergies as of 06/20/2020      Reactions   Darvon [propoxyphene] Nausea And Vomiting, Other (See Comments)   Darvocet- "years ago"- "Made me VERY sick."   Tape Rash, Other (See Comments)   "Tapes and Band-Aids blister my skin"      Medication List    STOP taking these medications   ibuprofen 200 MG tablet Commonly known as: ADVIL   metoprolol succinate 25  MG 24 hr tablet Commonly known as: TOPROL-XL     TAKE these medications   acetaminophen 325 MG tablet Commonly known as: TYLENOL Take 325-650 mg by mouth every 6 (six) hours as needed for mild pain (or headaches).   aspirin 81 MG chewable tablet Chew 81 mg by mouth daily.   clopidogrel 75 MG tablet Commonly known as: PLAVIX Take 1 tablet (75 mg total) by mouth daily. Start taking on: June 21, 2020   colchicine 0.6 MG tablet Take 0.5 tablets (0.3 mg total) by mouth 2 (two) times daily.   Lantus SoloStar 100 UNIT/ML Solostar Pen Generic drug: insulin glargine Inject 10 units into the skin in the morning after breakfast and in the evening, after supper/evening meal What changed:   how much to take  how to take this  when to take this  additional instructions   lisinopril 5 MG tablet Commonly known as: ZESTRIL Take 1 tablet (5 mg total) by mouth daily. What changed:   medication strength  how much to take   metFORMIN 500 MG tablet Commonly known as: GLUCOPHAGE Take 500 mg by mouth 2 (two) times daily.   oxyCODONE 5 MG  immediate release tablet Commonly known as: Oxy IR/ROXICODONE Take 1 tablet (5 mg total) by mouth every 6 (six) hours as needed for severe pain.   rosuvastatin 20 MG tablet Commonly known as: CRESTOR Take 20 mg by mouth daily.            Durable Medical Equipment  (From admission, onward)         Start     Ordered   06/18/20 1025  For home use only DME Walker rolling  Once       Question Answer Comment  Walker: With 5 Inch Wheels   Patient needs a walker to treat with the following condition Physical deconditioning      06/18/20 1025   06/18/20 1025  For home use only DME 3 n 1  Once        06/18/20 1025   06/16/20 1218  For home use only DME 3 n 1  Once        06/16/20 1218         The patient has been discharged on:   1.Beta Blocker:  Yes [ x  ]                              No   [   ]                              If No, reason:  2.Ace Inhibitor/ARB: Yes [ x ]                                     No  [    ]                                     If No, reason:  3.Statin:   Yes [ x  ]                  No  [   ]  If No, reason:  4.Ecasa:  Yes  [ x  ]                  No   [   ]                  If No, reason:  Follow Up Appointments:  Follow-up Information    [name], MD. Go on 07/03/2020.   Specialty: Cardiology Why: Appointment time is at 2:45 pm Contact information: [address] [place] [address] [phone]        Linden Dolin, MD. Go on 06/26/2020.   Specialty: Cardiothoracic Surgery Why: Appointment time is at 3:00 pm Contact information: [address] [place] [address] [phone]        Carron Curie, MD. Call.   Specialty: Internal Medicine Why: for follow up appointment regarding futher diabetes management and surveillance of HGA1C 9.5 Contact information: [address] [place] [address] [phone]        [facility] Follow up.   Why: rolling walker and 3n1  arranged- to be delivered to room prior to discharge Contact information: [address] [place] [address] [phone]        Chuck Hint, MD Follow up in 2 week(s).   Specialties: Vascular Surgery, Cardiology Why: the office will contact patient for appointment Contact information: [address] [place] [address] [phone]               Signed: Bernadette Hoit ContePA-C 06/20/2020, 12:12 PM

## 2020-06-16 NOTE — Evaluation (Signed)
Physical Therapy Evaluation Patient Details Name: Cynthia Hale MRN: [id number] DOB: January 06, 1966 Today's Date: 06/16/2020   History of Present Illness  Pt is a 54 year old woman admitted on 11/24 for CABG x 4. Pt had plans for cholecystectomy, but found to have a + stress test when preparing for procedure. PMH: DM, HLD, HTN, CAD s/p PCI 2 years ago.  Clinical Impression  Pt admitted with above diagnosis. Pt able to ambulate with Carley Hammed walker on unit with overall fair stability with min assist.  Needs assist with transitions and cues for sternal precautions. Should progress well and has family support.  Pt currently with functional limitations due to the deficits listed below (see PT Problem List). Pt will benefit from skilled PT to increase their independence and safety with mobility to allow discharge to the venue listed below.      Follow Up Recommendations Home health PT;Supervision/Assistance - 24 hour    Equipment Recommendations  Rolling walker with 5" wheels;3in1 (PT)    Recommendations for Other Services       Precautions / Restrictions Precautions Precautions: Fall;Sternal Precaution Booklet Issued: No Precaution Comments: began education in "move in the tube"      Mobility  Bed Mobility Overal bed mobility: Needs Assistance Bed Mobility: Sit to Supine       Sit to supine: +2 for physical assistance;Max assist   General bed mobility comments: assist to guide trunk and for LEs back into bed    Transfers Overall transfer level: Needs assistance Equipment used:  (eva walker) Transfers: Sit to/from Stand Sit to Stand: Min assist         General transfer comment: cues to hold pillow with sit<>stand, min assist to rise and steady  Ambulation/Gait Ambulation/Gait assistance: Min guard;Min assist;+2 safety/equipment Gait Distance (Feet): 230 Feet Assistive device:  (Eva walker) Gait Pattern/deviations: Step-through pattern;Decreased stride length;Drifts  right/left   Gait velocity interpretation: <1.31 ft/sec, indicative of household ambulator General Gait Details: Pt able to ambulate on unit with Carley Hammed walker and min guard to min assist.  Pt did well overall with Carley Hammed walker.    Stairs            Wheelchair Mobility    Modified Rankin (Stroke Patients Only)       Balance Overall balance assessment: Needs assistance Sitting-balance support: No upper extremity supported;Feet supported Sitting balance-Leahy Scale: Good     Standing balance support: Bilateral upper extremity supported Standing balance-Leahy Scale: Poor Standing balance comment: relies on UE support and external support                             Pertinent Vitals/Pain Pain Assessment: 0-10 Pain Score: 10-Worst pain ever Pain Location: incision Pain Descriptors / Indicators: Grimacing;Guarding;Discomfort;Sore Pain Intervention(s): Limited activity within patient's tolerance;Monitored during session;Repositioned;Patient requesting pain meds-RN notified;RN gave pain meds during session    Home Living Family/patient expects to be discharged to:: Private residence Living Arrangements: Alone Available Help at Discharge: Family;Available 24 hours/day (sister and daughter ) Type of Home: Apartment Home Access: Stairs to enter Entrance Stairs-Rails: Left Entrance Stairs-Number of Steps: flight Home Layout: One level Home Equipment: None      Prior Function Level of Independence: Independent         Comments: Drives, works in telemetry at [employer]     Hand Dominance   Dominant Hand: Left    Extremity/Trunk Assessment   Upper Extremity Assessment Upper Extremity Assessment: Defer to  OT evaluation    Lower Extremity Assessment Lower Extremity Assessment: Generalized weakness    Cervical / Trunk Assessment Cervical / Trunk Assessment: Normal  Communication   Communication: No difficulties  Cognition Arousal/Alertness:  Awake/alert Behavior During Therapy: Anxious Overall Cognitive Status: Within Functional Limits for tasks assessed                                        General Comments General comments (skin integrity, edema, etc.): 73 bpm, 93% 3L, RR26, 145/77.  Pt ambulated with 4LO2 to keep sats >90%.     Exercises     Assessment/Plan    PT Assessment Patient needs continued PT services  PT Problem List Decreased activity tolerance;Decreased balance;Decreased mobility;Decreased knowledge of use of DME;Decreased safety awareness;Decreased knowledge of precautions;Cardiopulmonary status limiting activity;Pain       PT Treatment Interventions DME instruction;Gait training;Functional mobility training;Therapeutic activities;Therapeutic exercise;Balance training;Stair training;Patient/family education    PT Goals (Current goals can be found in the Care Plan section)  Acute Rehab PT Goals Patient Stated Goal: get stronger PT Goal Formulation: With patient Time For Goal Achievement: 06/30/20 Potential to Achieve Goals: Good    Frequency Min 3X/week   Barriers to discharge        Co-evaluation PT/OT/SLP Co-Evaluation/Treatment: Yes Reason for Co-Treatment: Complexity of the patient's impairments (multi-system involvement);For patient/therapist safety PT goals addressed during session: Mobility/safety with mobility         AM-PAC PT "6 Clicks" Mobility  Outcome Measure Help needed turning from your back to your side while in a flat bed without using bedrails?: A Lot Help needed moving from lying on your back to sitting on the side of a flat bed without using bedrails?: A Lot Help needed moving to and from a bed to a chair (including a wheelchair)?: A Lot Help needed standing up from a chair using your arms (e.g., wheelchair or bedside chair)?: A Lot Help needed to walk in hospital room?: A Lot Help needed climbing 3-5 steps with a railing? : A Lot 6 Click Score: 12     End of Session Equipment Utilized During Treatment: Gait belt;Oxygen Activity Tolerance: Patient limited by fatigue Patient left: with call bell/phone within reach;in bed;with bed alarm set;with family/visitor present Nurse Communication: Mobility status PT Visit Diagnosis: Unsteadiness on feet (R26.81);Muscle weakness (generalized) (M62.81);Pain Pain - part of body:  (incision)    Time: 3893-7342 PT Time Calculation (min) (ACUTE ONLY): 32 min   Charges:   PT Evaluation $PT Eval Moderate Complexity: 1 Mod          Laquinton Bihm W,PT Acute Rehabilitation Services Pager:  [phone]  Office:  [phone]    Berline Lopes 06/16/2020, 1:17 PM

## 2020-06-16 NOTE — Evaluation (Signed)
Occupational Therapy Evaluation Patient Details Name: Cynthia Hale MRN: [id number] DOB: 08-Dec-1965 Today's Date: 06/16/2020    History of Present Illness Pt is a 54 year old woman admitted on 11/24 for CABG x 4. Pt had plans for cholecystectomy, but found to have a + stress test when preparing for procedure. PMH: DM, HLD, HTN, CAD s/p PCI 2 years ago.   Clinical Impression   Pt was independent prior to admission. Presents with post operative pain, decreased activity tolerance and impaired standing balance. Pt able to stand and ambulate with min assist. Requires 2 person assist to return to bed and min to total assist for ADL. Pt likely to progress well and be able to return home with the support of her daughter and/or sister.     Follow Up Recommendations  Home health OT;Supervision/Assistance - 24 hour    Equipment Recommendations  3 in 1 bedside commode    Recommendations for Other Services       Precautions / Restrictions Precautions Precautions: Fall;Sternal Precaution Booklet Issued: No Precaution Comments: began education in "move in the tube"      Mobility Bed Mobility Overal bed mobility: Needs Assistance Bed Mobility: Sit to Supine       Sit to supine: +2 for physical assistance;Max assist   General bed mobility comments: assist to guide trunk and for LEs back into bed    Transfers Overall transfer level: Needs assistance Equipment used:  (eva walker) Transfers: Sit to/from Stand Sit to Stand: Min assist         General transfer comment: cues to hold pillow with sit<>stand, min assist to rise and steady    Balance Overall balance assessment: Needs assistance   Sitting balance-Leahy Scale: Good     Standing balance support: Bilateral upper extremity supported Standing balance-Leahy Scale: Poor                             ADL either performed or assessed with clinical judgement   ADL Overall ADL's : Needs  assistance/impaired Eating/Feeding: Independent   Grooming: Minimal assistance;Sitting   Upper Body Bathing: Maximal assistance;Sitting   Lower Body Bathing: Total assistance;Sit to/from stand   Upper Body Dressing : Maximal assistance;Sitting   Lower Body Dressing: Total assistance;Sit to/from stand   Toilet Transfer: Minimal assistance;+2 for safety/equipment;Ambulation (eva)   Toileting- Clothing Manipulation and Hygiene: Total assistance;Sit to/from stand       Functional mobility during ADLs: Minimal assistance;+2 for safety/equipment (eva walker)       Vision Patient Visual Report: No change from baseline       Perception     Praxis      Pertinent Vitals/Pain Pain Assessment: 0-10 Pain Score: 10-Worst pain ever Pain Location: incision Pain Descriptors / Indicators: Grimacing;Guarding;Discomfort;Sore Pain Intervention(s): Monitored during session;RN gave pain meds during session;Repositioned     Hand Dominance Left   Extremity/Trunk Assessment Upper Extremity Assessment Upper Extremity Assessment: Overall WFL for tasks assessed   Lower Extremity Assessment Lower Extremity Assessment: Defer to PT evaluation   Cervical / Trunk Assessment Cervical / Trunk Assessment: Normal   Communication Communication Communication: No difficulties   Cognition Arousal/Alertness: Awake/alert Behavior During Therapy: Anxious Overall Cognitive Status: Within Functional Limits for tasks assessed                                     General Comments  73 bpm, 93% 3L, RR26, 145/77    Exercises     Shoulder Instructions      Home Living Family/patient expects to be discharged to:: Private residence Living Arrangements: Alone Available Help at Discharge: Family;Available 24 hours/day (sister and daughter ) Type of Home: Apartment Home Access: Stairs to enter Entrance Stairs-Number of Steps: flight Entrance Stairs-Rails: Left Home Layout: One level      Bathroom Shower/Tub: Chief Strategy Officer: Standard     Home Equipment: None          Prior Functioning/Environment Level of Independence: Independent        Comments: Drives, works in telemetry at [employer]        OT Problem List: Decreased strength;Decreased activity tolerance;Impaired balance (sitting and/or standing);Decreased knowledge of use of DME or AE;Decreased knowledge of precautions;Cardiopulmonary status limiting activity;Pain      OT Treatment/Interventions: Self-care/ADL training;DME and/or AE instruction;Patient/family education;Balance training;Therapeutic activities;Energy conservation    OT Goals(Current goals can be found in the care plan section) Acute Rehab OT Goals Patient Stated Goal: get stronger OT Goal Formulation: With patient Potential to Achieve Goals: Good ADL Goals Pt Will Perform Grooming: with supervision;standing Pt Will Perform Upper Body Bathing: with modified independence;sitting Pt Will Perform Lower Body Bathing: with supervision;sit to/from stand Pt Will Perform Upper Body Dressing: with modified independence;sitting Pt Will Perform Lower Body Dressing: with supervision;sit to/from stand Pt Will Transfer to Toilet: with supervision;ambulating;bedside commode (over toilet) Pt Will Perform Toileting - Clothing Manipulation and hygiene: with supervision;sit to/from stand Pt Will Perform Tub/Shower Transfer: Tub transfer;with min assist;ambulating;3 in 1 Additional ADL Goal #1: Pt will state at least 3 energy conservation strategies as instructed.  OT Frequency: Min 2X/week   Barriers to D/C:            Co-evaluation PT/OT/SLP Co-Evaluation/Treatment: Yes Reason for Co-Treatment: Complexity of the patient's impairments (multi-system involvement)   OT goals addressed during session: ADL's and self-care;Proper use of Adaptive equipment and DME      AM-PAC OT "6 Clicks" Daily Activity     Outcome Measure  Help from another person eating meals?: A Little Help from another person taking care of personal grooming?: A Little Help from another person toileting, which includes using toliet, bedpan, or urinal?: Total Help from another person bathing (including washing, rinsing, drying)?: A Lot Help from another person to put on and taking off regular upper body clothing?: A Lot Help from another person to put on and taking off regular lower body clothing?: Total 6 Click Score: 12   End of Session Equipment Utilized During Treatment: Oxygen;Gait belt (3L) Nurse Communication: Mobility status  Activity Tolerance: Patient tolerated treatment well Patient left: in bed;with call bell/phone within reach;with nursing/sitter in room;with family/visitor present  OT Visit Diagnosis: Unsteadiness on feet (R26.81);Other abnormalities of gait and mobility (R26.89);Muscle weakness (generalized) (M62.81);Pain                Time: 6599-3570 OT Time Calculation (min): 32 min Charges:  OT General Charges $OT Visit: 1 Visit OT Evaluation $OT Eval Moderate Complexity: 1 Mod  Martie Round, OTR/L Acute Rehabilitation Services Pager: [phone] Office: [phone] Evern Bio 06/16/2020, 11:14 AM

## 2020-06-16 NOTE — Progress Notes (Signed)
2 Days Post-Op Procedure(s) (LRB): CORONARY ARTERY BYPASS GRAFTING (CABG) TIMES FOUR USING LIMA TO LAD;RIMA TO OM1; RADIAL ARTERY SEQUENCED DIAG TO OM2 (N/A) RADIAL ARTERY HARVEST (Right) TRANSESOPHAGEAL ECHOCARDIOGRAM (TEE) (N/A) INDOCYANINE GREEN FLUORESCENCE IMAGING (ICG) (N/A) Subjective: Feeling better  Objective: Vital signs in last 24 hours: Temp:  [97.6 F (36.4 C)-98.8 F (37.1 C)] 97.6 F (36.4 C) (11/26 0400) Pulse Rate:  [70-80] 70 (11/25 1900) Cardiac Rhythm: Normal sinus rhythm;Atrial paced (11/26 0000) Resp:  [10-29] 16 (11/26 0700) BP: (103-152)/(52-79) 152/70 (11/26 0700) SpO2:  [86 %-100 %] 98 % (11/26 0700) Arterial Line BP: (118-158)/(52-67) 130/52 (11/25 1400)  Hemodynamic parameters for last 24 hours: PAP: (51-59)/(29-31) 59/31  Intake/Output from previous day: 11/25 0701 - 11/26 0700 In: 2259.4 [P.O.:1100; I.V.:959.4; IV Piggyback:200.1] Out: 1745 [Urine:965; Chest Tube:780] Intake/Output this shift: No intake/output data recorded.  General appearance: alert and cooperative Neurologic: intact Heart: regular rate and rhythm, S1, S2 normal, no murmur, click, rub or gallop Lungs: clear to auscultation bilaterally Abdomen: soft, non-tender; bowel sounds normal; no masses,  no organomegaly Extremities: extremities normal, atraumatic, no cyanosis or edema Wound: dressed  Lab Results: Recent Labs    06/15/20 1615 06/16/20 0544  WBC 11.2* 10.6*  HGB 8.3* 8.4*  HCT 26.0* 25.8*  PLT 192 197   BMET:  Recent Labs    06/15/20 1615 06/16/20 0544  NA 132* 134*  K 4.0 4.5  CL 101 102  CO2 21* 23  GLUCOSE 203* 167*  BUN 14 15  CREATININE 1.16* 1.07*  CALCIUM 8.1* 8.6*    PT/INR:  Recent Labs    06/14/20 1404  LABPROT 16.1*  INR 1.3*   ABG    Component Value Date/Time   PHART 7.332 (L) 06/14/2020 2308   HCO3 22.1 06/14/2020 2308   TCO2 23 06/14/2020 2308   ACIDBASEDEF 4.0 (H) 06/14/2020 2308   O2SAT 84.0 06/14/2020 2308   CBG (last  3)  Recent Labs    06/15/20 1927 06/15/20 2347 06/16/20 0406  GLUCAP 162* 142* 149*    Assessment/Plan: S/P Procedure(s) (LRB): CORONARY ARTERY BYPASS GRAFTING (CABG) TIMES FOUR USING LIMA TO LAD;RIMA TO OM1; RADIAL ARTERY SEQUENCED DIAG TO OM2 (N/A) RADIAL ARTERY HARVEST (Right) TRANSESOPHAGEAL ECHOCARDIOGRAM (TEE) (N/A) INDOCYANINE GREEN FLUORESCENCE IMAGING (ICG) (N/A) Mobilize Diuresis leave chest tubes  Suppository/bowel care   LOS: 3 days    Cynthia Hale 06/16/2020

## 2020-06-17 ENCOUNTER — Inpatient Hospital Stay (HOSPITAL_COMMUNITY): Payer: BC Managed Care – PPO

## 2020-06-17 LAB — BASIC METABOLIC PANEL
Anion gap: 8 (ref 5–15)
BUN: 15 mg/dL (ref 6–20)
CO2: 27 mmol/L (ref 22–32)
Calcium: 8.4 mg/dL — ABNORMAL LOW (ref 8.9–10.3)
Chloride: 103 mmol/L (ref 98–111)
Creatinine, Ser: 0.96 mg/dL (ref 0.44–1.00)
GFR, Estimated: >60 mL/min (ref >60)
Glucose, Bld: 105 mg/dL — ABNORMAL HIGH (ref 70–99)
Potassium: 3.8 mmol/L (ref 3.5–5.1)
Sodium: 138 mmol/L (ref 135–145)

## 2020-06-17 LAB — GLUCOSE, CAPILLARY
Glucose-Capillary: 100 mg/dL — ABNORMAL HIGH (ref 70–99)
Glucose-Capillary: 124 mg/dL — ABNORMAL HIGH (ref 70–99)
Glucose-Capillary: 143 mg/dL — ABNORMAL HIGH (ref 70–99)
Glucose-Capillary: 169 mg/dL — ABNORMAL HIGH (ref 70–99)
Glucose-Capillary: 189 mg/dL — ABNORMAL HIGH (ref 70–99)
Glucose-Capillary: 214 mg/dL — ABNORMAL HIGH (ref 70–99)
Glucose-Capillary: 84 mg/dL (ref 70–99)

## 2020-06-17 LAB — CBC
HCT: 24.2 % — ABNORMAL LOW (ref 36.0–46.0)
HCT: 29.2 % — ABNORMAL LOW (ref 36.0–46.0)
Hemoglobin: 7.7 g/dL — ABNORMAL LOW (ref 12.0–15.0)
Hemoglobin: 9.8 g/dL — ABNORMAL LOW (ref 12.0–15.0)
MCH: 29.3 pg (ref 26.0–34.0)
MCH: 30.3 pg (ref 26.0–34.0)
MCHC: 31.8 g/dL (ref 30.0–36.0)
MCHC: 33.6 g/dL (ref 30.0–36.0)
MCV: 92 fL (ref 80.0–100.0)
MCV: 90.4 fL (ref 80.0–100.0)
Platelets: 236 10*3/uL (ref 150–400)
Platelets: 284 10*3/uL (ref 150–400)
RBC: 2.63 MIL/uL — ABNORMAL LOW (ref 3.87–5.11)
RBC: 3.23 MIL/uL — ABNORMAL LOW (ref 3.87–5.11)
RDW: 13.3 % (ref 11.5–15.5)
RDW: 13.1 % (ref 11.5–15.5)
WBC: 10 10*3/uL (ref 4.0–10.5)
WBC: 10 10*3/uL (ref 4.0–10.5)
nRBC: 0 % (ref 0.0–0.2)
nRBC: 0 % (ref 0.0–0.2)

## 2020-06-17 LAB — PREPARE RBC (CROSSMATCH)

## 2020-06-17 MED ORDER — LEVALBUTEROL TARTRATE 45 MCG/ACT IN AERO
2.0000 | INHALATION_SPRAY | Freq: Four times a day (QID) | RESPIRATORY_TRACT | Status: DC
  Administered 2020-06-17 – 2020-06-18 (×5): 2 via RESPIRATORY_TRACT
  Filled 2020-06-17: qty 15

## 2020-06-17 MED ORDER — PNEUMOCOCCAL VAC POLYVALENT 25 MCG/0.5ML IJ INJ
0.5000 mL | INJECTION | INTRAMUSCULAR | Status: DC
  Filled 2020-06-17: qty 0.5

## 2020-06-17 MED ORDER — SODIUM CHLORIDE 0.9% IV SOLUTION: Freq: Once | INTRAVENOUS | Status: DC

## 2020-06-17 MED ORDER — FE FUMARATE-B12-VIT C-FA-IFC PO CAPS
1.0000 | ORAL_CAPSULE | Freq: Two times a day (BID) | ORAL | Status: DC
  Administered 2020-06-17 – 2020-06-20 (×7): 1 via ORAL
  Filled 2020-06-17 (×9): qty 1

## 2020-06-17 MED ORDER — POTASSIUM CHLORIDE CRYS ER 20 MEQ PO TBCR
20.0000 meq | EXTENDED_RELEASE_TABLET | Freq: Two times a day (BID) | ORAL | Status: DC
  Administered 2020-06-17 – 2020-06-18 (×4): 20 meq via ORAL
  Filled 2020-06-17 (×4): qty 1

## 2020-06-17 NOTE — Progress Notes (Signed)
3 Days Post-Op Procedure(s) (LRB): CORONARY ARTERY BYPASS GRAFTING (CABG) TIMES FOUR USING LIMA TO LAD;RIMA TO OM1; RADIAL ARTERY SEQUENCED DIAG TO OM2 (N/A) RADIAL ARTERY HARVEST (Right) TRANSESOPHAGEAL ECHOCARDIOGRAM (TEE) (N/A) INDOCYANINE GREEN FLUORESCENCE IMAGING (ICG) (N/A) Subjective: Feeling better  Objective: Vital signs in last 24 hours: Temp:  [97.9 F (36.6 C)-98.5 F (36.9 C)] 98.5 F (36.9 C) (11/27 0400) Cardiac Rhythm: Normal sinus rhythm (11/27 0000) Resp:  [10-30] 20 (11/27 0600) BP: (79-133)/(56-83) 127/69 (11/27 0600) SpO2:  [85 %-100 %] 100 % (11/27 0600) Weight:  [79.8 kg] 79.8 kg (11/27 0600)  Hemodynamic parameters for last 24 hours:    Intake/Output from previous day: 11/26 0701 - 11/27 0700 In: 320 [P.O.:320] Out: 2785 [Urine:2355; Chest Tube:430] Intake/Output this shift: No intake/output data recorded.  General appearance: alert and cooperative Neurologic: intact Heart: regular rate and rhythm, S1, S2 normal, no murmur, click, rub or gallop Lungs: clear to auscultation bilaterally Abdomen: soft, non-tender; bowel sounds normal; no masses,  no organomegaly Extremities: extremities normal, atraumatic, no cyanosis or edema  Lab Results: Recent Labs    06/16/20 0544 06/17/20 0348  WBC 10.6* 10.0  HGB 8.4* 7.7*  HCT 25.8* 24.2*  PLT 197 236   BMET:  Recent Labs    06/16/20 0544 06/17/20 0348  NA 134* 138  K 4.5 3.8  CL 102 103  CO2 23 27  GLUCOSE 167* 105*  BUN 15 15  CREATININE 1.07* 0.96  CALCIUM 8.6* 8.4*    PT/INR:  Recent Labs    06/14/20 1404  LABPROT 16.1*  INR 1.3*   ABG    Component Value Date/Time   PHART 7.332 (L) 06/14/2020 2308   HCO3 22.1 06/14/2020 2308   TCO2 23 06/14/2020 2308   ACIDBASEDEF 4.0 (H) 06/14/2020 2308   O2SAT 84.0 06/14/2020 2308   CBG (last 3)  Recent Labs    06/16/20 2350 06/17/20 0347 06/17/20 0657  GLUCAP 124* 84 143*    Assessment/Plan: S/P Procedure(s) (LRB): CORONARY  ARTERY BYPASS GRAFTING (CABG) TIMES FOUR USING LIMA TO LAD;RIMA TO OM1; RADIAL ARTERY SEQUENCED DIAG TO OM2 (N/A) RADIAL ARTERY HARVEST (Right) TRANSESOPHAGEAL ECHOCARDIOGRAM (TEE) (N/A) INDOCYANINE GREEN FLUORESCENCE IMAGING (ICG) (N/A) Mobilize Diuresis Plan for transfer to step-down: see transfer orders   LOS: 4 days    Linden Dolin 06/17/2020

## 2020-06-18 ENCOUNTER — Inpatient Hospital Stay (HOSPITAL_COMMUNITY): Payer: BC Managed Care – PPO

## 2020-06-18 ENCOUNTER — Encounter (HOSPITAL_COMMUNITY): Payer: Self-pay | Admitting: Cardiothoracic Surgery

## 2020-06-18 LAB — CBC
HCT: 29.1 % — ABNORMAL LOW (ref 36.0–46.0)
Hemoglobin: 9.6 g/dL — ABNORMAL LOW (ref 12.0–15.0)
MCH: 30.1 pg (ref 26.0–34.0)
MCHC: 33 g/dL (ref 30.0–36.0)
MCV: 91.2 fL (ref 80.0–100.0)
Platelets: 292 10*3/uL (ref 150–400)
RBC: 3.19 MIL/uL — ABNORMAL LOW (ref 3.87–5.11)
RDW: 13.3 % (ref 11.5–15.5)
WBC: 9.5 10*3/uL (ref 4.0–10.5)
nRBC: 0 % (ref 0.0–0.2)

## 2020-06-18 LAB — BASIC METABOLIC PANEL
Anion gap: 10 (ref 5–15)
BUN: 13 mg/dL (ref 6–20)
CO2: 29 mmol/L (ref 22–32)
Calcium: 8.6 mg/dL — ABNORMAL LOW (ref 8.9–10.3)
Chloride: 100 mmol/L (ref 98–111)
Creatinine, Ser: 0.99 mg/dL (ref 0.44–1.00)
GFR, Estimated: >60 mL/min (ref >60)
Glucose, Bld: 148 mg/dL — ABNORMAL HIGH (ref 70–99)
Potassium: 4.1 mmol/L (ref 3.5–5.1)
Sodium: 139 mmol/L (ref 135–145)

## 2020-06-18 LAB — TYPE AND SCREEN
ABO/RH(D): B POS
Antibody Screen: NEGATIVE
Unit division: 0

## 2020-06-18 LAB — GLUCOSE, CAPILLARY
Glucose-Capillary: 138 mg/dL — ABNORMAL HIGH (ref 70–99)
Glucose-Capillary: 162 mg/dL — ABNORMAL HIGH (ref 70–99)
Glucose-Capillary: 211 mg/dL — ABNORMAL HIGH (ref 70–99)
Glucose-Capillary: 251 mg/dL — ABNORMAL HIGH (ref 70–99)
Glucose-Capillary: 188 mg/dL — ABNORMAL HIGH (ref 70–99)

## 2020-06-18 LAB — BPAM RBC
Blood Product Expiration Date: 202112032359
ISSUE DATE / TIME: 202111271122
Unit Type and Rh: 7300

## 2020-06-18 MED ORDER — INSULIN ASPART 100 UNIT/ML ~~LOC~~ SOLN
0.0000 [IU] | Freq: Three times a day (TID) | SUBCUTANEOUS | Status: DC
  Administered 2020-06-18: 12 [IU] via SUBCUTANEOUS
  Administered 2020-06-18: 4 [IU] via SUBCUTANEOUS
  Administered 2020-06-19: 2 [IU] via SUBCUTANEOUS
  Administered 2020-06-19: 12 [IU] via SUBCUTANEOUS

## 2020-06-18 MED ORDER — LEVALBUTEROL TARTRATE 45 MCG/ACT IN AERO
2.0000 | INHALATION_SPRAY | Freq: Four times a day (QID) | RESPIRATORY_TRACT | Status: DC | PRN
  Filled 2020-06-18: qty 15

## 2020-06-18 NOTE — Consult Note (Signed)
[name], MD   Subjective:  Feeling better with steady progress. VS stable except dependent on pacemaker. Ambulating and ready for transfer to step down.  Objective:  Vital Signs in the last 24 hours: Temp:  [98 F (36.7 C)-98.8 F (37.1 C)] 98.8 F (37.1 C) (11/28 0743) Pulse Rate:  [70-80] 70 (11/28 0848) Cardiac Rhythm: Atrial paced (11/28 0600) Resp:  [10-21] 18 (11/28 0848) BP: (96-141)/(53-80) 141/69 (11/28 0848) SpO2:  [92 %-100 %] 93 % (11/28 0848) Weight:  [77.9 kg] 77.9 kg (11/28 0500)  Physical Exam: BP Readings from Last 1 Encounters:  06/18/20 (!) 141/69     Wt Readings from Last 1 Encounters:  06/18/20 77.9 kg    Weight change: -1.9 kg Body mass index is 30.42 kg/m. HEENT: [place]/AT, Eyes-Blue, Conjunctiva-Pale pink, Sclera-Non-icteric Neck: No JVD, No bruit, Trachea midline. Lungs:  Clearing, Bilateral. Cardiac:  Regular and paced rhythm, normal S1 and S2, no S3. II/VI systolic murmur. Abdomen:  Soft, non-tender. BS present. Extremities:  No edema present. No cyanosis. No clubbing. CNS: AxOx3, Cranial nerves grossly intact, moves all 4 extremities.  Skin: Warm and dry.   Intake/Output from previous day: 11/27 0701 - 11/28 0700 In: 949.7 [P.O.:600; I.V.:3; Blood:346.7] Out: 3800 [Urine:3780; Chest Tube:20]    Lab Results: BMET    Component Value Date/Time   NA 139 06/18/2020 0346   NA 138 06/17/2020 0348   NA 134 (L) 06/16/2020 0544   K 4.1 06/18/2020 0346   K 3.8 06/17/2020 0348   K 4.5 06/16/2020 0544   CL 100 06/18/2020 0346   CL 103 06/17/2020 0348   CL 102 06/16/2020 0544   CO2 29 06/18/2020 0346   CO2 27 06/17/2020 0348   CO2 23 06/16/2020 0544   GLUCOSE 148 (H) 06/18/2020 0346   GLUCOSE 105 (H) 06/17/2020 0348   GLUCOSE 167 (H) 06/16/2020 0544   BUN 13 06/18/2020 0346   BUN 15 06/17/2020 0348   BUN 15 06/16/2020 0544   CREATININE 0.99 06/18/2020 0346   CREATININE 0.96 06/17/2020 0348   CREATININE 1.07 (H) 06/16/2020 0544    CALCIUM 8.6 (L) 06/18/2020 0346   CALCIUM 8.4 (L) 06/17/2020 0348   CALCIUM 8.6 (L) 06/16/2020 0544   GFRNONAA >60 06/18/2020 0346   GFRNONAA >60 06/17/2020 0348   GFRNONAA >60 06/16/2020 0544   GFRAA >60 06/08/2017 1649   CBC    Component Value Date/Time   WBC 9.5 06/18/2020 0346   RBC 3.19 (L) 06/18/2020 0346   HGB 9.6 (L) 06/18/2020 0346   HCT 29.1 (L) 06/18/2020 0346   PLT 292 06/18/2020 0346   MCV 91.2 06/18/2020 0346   MCH 30.1 06/18/2020 0346   MCHC 33.0 06/18/2020 0346   RDW 13.3 06/18/2020 0346   LYMPHSABS 2.6 06/12/2020 1456   MONOABS 0.4 06/12/2020 1456   EOSABS 0.0 06/12/2020 1456   BASOSABS 0.1 06/12/2020 1456   HEPATIC Function Panel Recent Labs    06/12/20 1456 06/15/20 1615  PROT 6.7 5.7*   HEMOGLOBIN A1C No components found for: HGA1C,  MPG CARDIAC ENZYMES No results found for: CKTOTAL, CKMB, CKMBINDEX, TROPONINI BNP No results for input(s): PROBNP in the last 8760 hours. TSH No results for input(s): TSH in the last 8760 hours. CHOLESTEROL Recent Labs    06/13/20 0148  CHOL 187    Scheduled Meds: . acetaminophen  1,000 mg Oral Q6H   Or  . acetaminophen (TYLENOL) oral liquid 160 mg/5 mL  1,000 mg Per Tube Q6H  . aspirin  EC  81 mg Oral Daily  . bisacodyl  10 mg Oral Daily   Or  . bisacodyl  10 mg Rectal Daily  . bisacodyl  10 mg Rectal Once  . Chlorhexidine Gluconate Cloth  6 each Topical Daily  . clopidogrel  75 mg Oral Daily  . colchicine  0.3 mg Oral BID  . docusate sodium  200 mg Oral Daily  . ferrous fumarate-b12-vitamic C-folic acid  1 capsule Oral BID PC  . furosemide  40 mg Intravenous BID  . insulin aspart  0-24 Units Subcutaneous Q4H  . isosorbide dinitrate  20 mg Oral TID  . levalbuterol  2 puff Inhalation Q6H  . metoCLOPramide (REGLAN) injection  5 mg Intravenous Q12H  . metoprolol tartrate  12.5 mg Oral BID   Or  . metoprolol tartrate  12.5 mg Per Tube BID  . pantoprazole  40 mg Oral Daily  . pneumococcal 23 valent  vaccine  0.5 mL Intramuscular Tomorrow-1000  . potassium chloride  20 mEq Oral BID  . rosuvastatin  20 mg Oral Daily  . sodium chloride flush  3 mL Intravenous Q12H   Continuous Infusions: . lactated ringers     PRN Meds:.dextrose, lactated ringers, metoprolol tartrate, ondansetron (ZOFRAN) IV, oxyCODONE, traMADol  Assessment/Plan: CABG, 4 V, day 4 CAD S/P LCx stent Temporary pacemaker dependence Anemia of blood loss Type 2 DM Hyperlipidemia  Appreciate CVTS treatment. Increase activity as tolerated. May need to hold metoprolol to improve native heart rate.     LOS: 5 days   Time spent including chart review, lab review, examination, discussion with patient : 30 min   Cynthia Cobb  MD  06/18/2020, 11:02 AM

## 2020-06-18 NOTE — Progress Notes (Signed)
Patient arrived to 4E12 from [facility]. CHG bath completed. VSS. Patient oriented to room. Bed low and locked with call bell in reach. Will continue to monitor closely.

## 2020-06-18 NOTE — Progress Notes (Signed)
Pt on pacer AAI, rate of 70, 11 milliamps , sensitivity  of 0.5 set up by Dr Renaldo Fiddler per day shift nurse. Ventricular wire taped and protected

## 2020-06-18 NOTE — Plan of Care (Signed)
Pt, vs stable but is currently on pacer, pain controlled

## 2020-06-19 ENCOUNTER — Inpatient Hospital Stay (HOSPITAL_COMMUNITY): Payer: BC Managed Care – PPO

## 2020-06-19 ENCOUNTER — Encounter (HOSPITAL_COMMUNITY): Payer: Self-pay | Admitting: Cardiothoracic Surgery

## 2020-06-19 LAB — BASIC METABOLIC PANEL
Anion gap: 13 (ref 5–15)
BUN: 13 mg/dL (ref 6–20)
CO2: 28 mmol/L (ref 22–32)
Calcium: 9.3 mg/dL (ref 8.9–10.3)
Chloride: 96 mmol/L — ABNORMAL LOW (ref 98–111)
Creatinine, Ser: 1.02 mg/dL — ABNORMAL HIGH (ref 0.44–1.00)
GFR, Estimated: >60 mL/min (ref >60)
Glucose, Bld: 170 mg/dL — ABNORMAL HIGH (ref 70–99)
Potassium: 3.9 mmol/L (ref 3.5–5.1)
Sodium: 137 mmol/L (ref 135–145)

## 2020-06-19 LAB — CBC
HCT: 31.7 % — ABNORMAL LOW (ref 36.0–46.0)
Hemoglobin: 10.8 g/dL — ABNORMAL LOW (ref 12.0–15.0)
MCH: 30.4 pg (ref 26.0–34.0)
MCHC: 34.1 g/dL (ref 30.0–36.0)
MCV: 89.3 fL (ref 80.0–100.0)
Platelets: 400 10*3/uL (ref 150–400)
RBC: 3.55 MIL/uL — ABNORMAL LOW (ref 3.87–5.11)
RDW: 13 % (ref 11.5–15.5)
WBC: 8.6 10*3/uL (ref 4.0–10.5)
nRBC: 0 % (ref 0.0–0.2)

## 2020-06-19 LAB — GLUCOSE, CAPILLARY
Glucose-Capillary: 160 mg/dL — ABNORMAL HIGH (ref 70–99)
Glucose-Capillary: 261 mg/dL — ABNORMAL HIGH (ref 70–99)
Glucose-Capillary: 105 mg/dL — ABNORMAL HIGH (ref 70–99)
Glucose-Capillary: 228 mg/dL — ABNORMAL HIGH (ref 70–99)

## 2020-06-19 MED ORDER — INSULIN ASPART 100 UNIT/ML ~~LOC~~ SOLN
3.0000 [IU] | Freq: Three times a day (TID) | SUBCUTANEOUS | Status: DC
  Administered 2020-06-19 – 2020-06-20 (×2): 3 [IU] via SUBCUTANEOUS

## 2020-06-19 MED ORDER — IOHEXOL 350 MG/ML SOLN
75.0000 mL | Freq: Once | INTRAVENOUS | Status: AC | PRN
  Administered 2020-06-19: 75 mL via INTRAVENOUS

## 2020-06-19 MED ORDER — FUROSEMIDE 40 MG PO TABS
40.0000 mg | ORAL_TABLET | Freq: Every day | ORAL | Status: DC
  Administered 2020-06-19 – 2020-06-20 (×2): 40 mg via ORAL
  Filled 2020-06-19 (×2): qty 1

## 2020-06-19 MED ORDER — POTASSIUM CHLORIDE CRYS ER 20 MEQ PO TBCR
20.0000 meq | EXTENDED_RELEASE_TABLET | Freq: Every day | ORAL | Status: DC
  Administered 2020-06-19 – 2020-06-20 (×2): 20 meq via ORAL
  Filled 2020-06-19 (×2): qty 1

## 2020-06-19 MED ORDER — HYDRALAZINE HCL 10 MG PO TABS
10.0000 mg | ORAL_TABLET | Freq: Two times a day (BID) | ORAL | Status: DC
  Administered 2020-06-19 – 2020-06-20 (×3): 10 mg via ORAL
  Filled 2020-06-19 (×3): qty 1

## 2020-06-19 MED ORDER — INSULIN GLARGINE 100 UNIT/ML ~~LOC~~ SOLN
10.0000 [IU] | Freq: Every day | SUBCUTANEOUS | Status: DC
  Administered 2020-06-19 – 2020-06-20 (×2): 10 [IU] via SUBCUTANEOUS
  Filled 2020-06-19 (×2): qty 0.1

## 2020-06-19 MED ORDER — INSULIN ASPART 100 UNIT/ML ~~LOC~~ SOLN
0.0000 [IU] | Freq: Three times a day (TID) | SUBCUTANEOUS | Status: DC
  Administered 2020-06-20: 3 [IU] via SUBCUTANEOUS

## 2020-06-19 NOTE — Progress Notes (Addendum)
      [address]       [place] [address]             [phone]      5 Days Post-Op Procedure(s) (LRB): CORONARY ARTERY BYPASS GRAFTING (CABG) TIMES FOUR USING LIMA TO LAD;RIMA TO OM1; RADIAL ARTERY SEQUENCED DIAG TO OM2 (N/A) RADIAL ARTERY HARVEST (Right) TRANSESOPHAGEAL ECHOCARDIOGRAM (TEE) (N/A) INDOCYANINE GREEN FLUORESCENCE IMAGING (ICG) (N/A) Subjective: Feels good today. No issues.  Objective: Vital signs in last 24 hours: Temp:  [98.1 F (36.7 C)-98.8 F (37.1 C)] 98.3 F (36.8 C) (11/29 0353) Pulse Rate:  [68-70] 70 (11/29 0353) Cardiac Rhythm: Supraventricular tachycardia (11/29 0442) Resp:  [15-22] 15 (11/29 0353) BP: (114-141)/(58-69) 120/66 (11/29 0353) SpO2:  [77 %-99 %] 98 % (11/29 0353) Weight:  [75.8 kg] 75.8 kg (11/29 0636)     Intake/Output from previous day: 11/28 0701 - 11/29 0700 In: 840 [P.O.:840] Out: 3400 [Urine:3400] Intake/Output this shift: No intake/output data recorded.  General appearance: alert, cooperative and no distress Heart: regular rate and rhythm, S1, S2 normal, no murmur, click, rub or gallop Lungs: clear to auscultation bilaterally Abdomen: soft, non-tender; bowel sounds normal; no masses,  no organomegaly Extremities: extremities normal, atraumatic, no cyanosis or edema Wound: clean and dry, staples in place  Lab Results: Recent Labs    06/17/20 1506 06/18/20 0346  WBC 10.0 9.5  HGB 9.8* 9.6*  HCT 29.2* 29.1*  PLT 284 292   BMET:  Recent Labs    06/17/20 0348 06/18/20 0346  NA 138 139  K 3.8 4.1  CL 103 100  CO2 27 29  GLUCOSE 105* 148*  BUN 15 13  CREATININE 0.96 0.99  CALCIUM 8.4* 8.6*    PT/INR: No results for input(s): LABPROT, INR in the last 72 hours. ABG    Component Value Date/Time   PHART 7.332 (L) 06/14/2020 2308   HCO3 22.1 06/14/2020 2308   TCO2 23 06/14/2020 2308   ACIDBASEDEF 4.0 (H) 06/14/2020 2308   O2SAT 84.0 06/14/2020 2308   CBG (last 3)  Recent Labs     06/18/20 1713 06/18/20 2115 06/19/20 0618  GLUCAP 251* 188* 160*    Assessment/Plan: S/P Procedure(s) (LRB): CORONARY ARTERY BYPASS GRAFTING (CABG) TIMES FOUR USING LIMA TO LAD;RIMA TO OM1; RADIAL ARTERY SEQUENCED DIAG TO OM2 (N/A) RADIAL ARTERY HARVEST (Right) TRANSESOPHAGEAL ECHOCARDIOGRAM (TEE) (N/A) INDOCYANINE GREEN FLUORESCENCE IMAGING (ICG) (N/A)  1. CV- NSR in the 60s under the pacer, remains paced, holding BB, BP well controlled. Continue asa and statin. Continue isordil for radial artery harvest. Appreciate cardiology assistance 2. Pulm-tolerating 2L [place] with good oxygen saturation. Similar low lung volumes with bibasilar atelectasis 3. Renal-creatinine 0.96, electrolytes okay. Will change lasix to 40mg  oral daily.  4. H and H 7.7/24.3, slowly been decreased over the last few days. 5. Endo- blood glucose good this morning but at times she does get above 200.  Plan: Continue to ambulate in the halls. NSR in the 60s under her pacer, can probably turn off today. Continue to wean oxygen as tolerated. Appetite is good without n/v. Hopefully home soon.    LOS: 6 days    01-04-2003 06/19/2020   Pt seen and examined; agree with documentation. Likely discharge home tomorrow Cynthia Hale Z. 06/21/2020, MD [phone]

## 2020-06-19 NOTE — Progress Notes (Signed)
VASCULAR SURGERY:  The patient is doing well status post CABG.  She has an asymptomatic approximately 80% right carotid stenosis.  Based on the duplex the stenosis extends high.  She has no significant stenosis on the left.  As per my previous note I recommended a CT angiogram of the head and neck prior to discharge.  If the right carotid stenosis needs to be addressed this would determine if she was a candidate for transcarotid stenting.  I have discussed this with the patient and she is agreeable.  Her renal function is normal.  She has no dye allergy.  I will follow up with her as an outpatient to discuss possible right carotid endarterectomy versus transcarotid stenting versus continued observation.  Waverly Ferrari, MD Office: [phone]

## 2020-06-19 NOTE — Progress Notes (Signed)
CARDIAC REHAB PHASE I   PRE:  Rate/Rhythm: 74 SR    BP: sitting 152/69    SaO2: 93 RA  MODE:  Ambulation: 470 ft   POST:  Rate/Rhythm: 84 SR    BP: sitting 169/71     SaO2: 94 RA  Pt able to ambulate hall independently. SaO2 94 RA. No c/o, no arrhythmia. Gave DM and HH diet sheets to begin reviewing. She sts he has never had formal DM ed and would like referral for outpatient education. [phone]   Cynthia Hale CES, ACSM 06/19/2020 3:01 PM

## 2020-06-19 NOTE — Progress Notes (Signed)
Mobility Specialist - Progress Note   06/19/20 1322  Mobility  Activity Ambulated in hall  Level of Assistance Standby assist, set-up cues, supervision of patient - no hands on  Assistive Device None  Distance Ambulated (ft) 470 ft  Mobility Response Tolerated well  Mobility performed by Mobility specialist  $Mobility charge 1 Mobility    Pre-mobility: 80 HR During mobility: 94 HR Post-mobility: 85 HR  Pt asx throughout ambulation. States she has frequently been going to the bathroom. Back in bed after walk.   Mamie Levers Mobility Specialist Mobility Specialist Phone: [phone]

## 2020-06-19 NOTE — Progress Notes (Signed)
Occupational Therapy Treatment and Discharge Patient Details Name: Cynthia Hale MRN: [id number] DOB: 1966-04-10 Today's Date: 06/19/2020    History of present illness Pt is a 54 year old woman admitted on 11/24 for CABG x 4. Pt had plans for cholecystectomy, but found to have a + stress test when preparing for procedure. PMH: DM, HLD, HTN, CAD s/p PCI 2 years ago.   OT comments  Pt progressing well. Educated in sternal precautions related to ADL and IADL, IADL to avoid, energy conservation strategies and provided written handout. Instructed in multiple uses of 3 in 1. Pt is able to perform basic self care with set up to modified independently. She will have assist of her sister upon discharge as her daughter is returning to her her home out of state. Recommend continued ADL with nursing staff.   Follow Up Recommendations  No OT follow up    Equipment Recommendations  3 in 1 bedside commode    Recommendations for Other Services      Precautions / Restrictions Precautions Precautions: Sternal Precaution Comments: cues to generalize sternal precautions       Mobility Bed Mobility               General bed mobility comments: in chair  Transfers Overall transfer level: Modified independent Equipment used: None             General transfer comment: slow, but no assist    Balance Overall balance assessment: Needs assistance Sitting-balance support: No upper extremity supported Sitting balance-Leahy Scale: Good     Standing balance support: No upper extremity supported Standing balance-Leahy Scale: Fair                             ADL either performed or assessed with clinical judgement   ADL Overall ADL's : Modified independent                                       General ADL Comments: Educated in energy conservation strategies, multiple uses of 3 in 1.     Vision       Perception     Praxis      Cognition  Arousal/Alertness: Awake/alert Behavior During Therapy: WFL for tasks assessed/performed Overall Cognitive Status: Within Functional Limits for tasks assessed                                          Exercises     Shoulder Instructions       General Comments      Pertinent Vitals/ Pain       Pain Assessment: Faces Faces Pain Scale: Hurts a little bit Pain Location: chest incision Pain Descriptors / Indicators: Sore Pain Intervention(s): Monitored during session  Home Living                                          Prior Functioning/Environment              Frequency           Progress Toward Goals  OT Goals(current goals can now be found in the care plan section)  Progress towards  OT goals: Progressing toward goals  Acute Rehab OT Goals Patient Stated Goal: get stronger OT Goal Formulation: With patient Potential to Achieve Goals: Good  Plan All goals met and education completed, patient discharged from OT services    Co-evaluation                 AM-PAC OT "6 Clicks" Daily Activity     Outcome Measure   Help from another person eating meals?: None Help from another person taking care of personal grooming?: None Help from another person toileting, which includes using toliet, bedpan, or urinal?: None Help from another person bathing (including washing, rinsing, drying)?: None Help from another person to put on and taking off regular upper body clothing?: None Help from another person to put on and taking off regular lower body clothing?: None 6 Click Score: 24    End of Session    OT Visit Diagnosis: Pain   Activity Tolerance Patient tolerated treatment well   Patient Left in chair;with call bell/phone within reach;with family/visitor present   Nurse Communication          Time: 7829-5621 OT Time Calculation (min): 16 min  Charges: OT General Charges $OT Visit: 1 Visit OT Treatments $Self  Care/Home Management : 8-22 mins  Nestor Lewandowsky, OTR/L Acute Rehabilitation Services Pager: [phone] Office: [phone]   Malka So 06/19/2020, 1:20 PM

## 2020-06-19 NOTE — Progress Notes (Signed)
Physical Therapy Treatment Patient Details Name: Cynthia Hale MRN: [id number] DOB: 01/03/1966 Today's Date: 06/19/2020    History of Present Illness Pt is a 54 year old woman admitted on 11/24 for CABG x 4. Pt had plans for cholecystectomy, but found to have a + stress test when preparing for procedure. PMH: DM, HLD, HTN, CAD s/p PCI 2 years ago.    PT Comments    Pt progressing very well towards all goals. Pt with DOE 3/4 during stair negotiation with SpO2 at 85% on RA requiring standing rest break with purse lipped breathing, returned to 92%. Pt amb 200' without AD. Upon return to chair pt had a 9 beat run of tachycardia with HR at 180 but quickly returned to 77bpm. RN made aware. Pt reported feeling her heart racing and returning to normal. Acute PT to cont to follow.    Follow Up Recommendations  Home health PT;Supervision/Assistance - 24 hour (progressing well and may not need HHPT)     Equipment Recommendations  None recommended by PT    Recommendations for Other Services       Precautions / Restrictions Precautions Precautions: Fall;Sternal Precaution Booklet Issued: No Precaution Comments: pt able to recall sternal precautions, required verbal cues to adhere functional as pt tends to reach behind her Restrictions Weight Bearing Restrictions: Yes (sternal precautions)    Mobility  Bed Mobility Overal bed mobility: Modified Independent             General bed mobility comments: HOB all the way up, pt brought self to EOB  Transfers Overall transfer level: Needs assistance Equipment used: None Transfers: Sit to/from Stand Sit to Stand: Min guard         General transfer comment: pt held heart pillow and placed R hand on knee to push up into standing, no physical assist needed, increased time  Ambulation/Gait Ambulation/Gait assistance: Min guard Gait Distance (Feet): 200 Feet Assistive device: None Gait Pattern/deviations: Step-through pattern;Decreased  stride length Gait velocity: slow Gait velocity interpretation: 1.31 - 2.62 ft/sec, indicative of limited community ambulator General Gait Details: pt slow and cautious but steady, SPO2 86-91% on RA, pt with DOE but practiced purse lipped breathing   Stairs Stairs: Yes Stairs assistance: Min guard Stair Management: One rail Left;Step to pattern;Forwards Number of Stairs: 12 General stair comments: slow and steady, +SOB at top of stairs requirng rest break and purse lipped breathing, SPO2 at 85% on RA, pt reports descending stairs were less taxing   Wheelchair Mobility    Modified Rankin (Stroke Patients Only)       Balance Overall balance assessment: Needs assistance Sitting-balance support: No upper extremity supported Sitting balance-Leahy Scale: Good Sitting balance - Comments: '   Standing balance support: No upper extremity supported Standing balance-Leahy Scale: Fair                              Cognition Arousal/Alertness: Awake/alert Behavior During Therapy: WFL for tasks assessed/performed Overall Cognitive Status: Within Functional Limits for tasks assessed                                        Exercises      General Comments General comments (skin integrity, edema, etc.): SpO2 86-91%, inc to 97% once seated on RA, had about a "9 beat" run of tachycarida with HR at 180bpm but  quiclky returned to 77bpm, pt reports "It feels like my heart is pounding, now it's fine"      Pertinent Vitals/Pain Pain Assessment: Faces Faces Pain Scale: Hurts even more Pain Location: gall bladder, R flank s/p stairs Pain Descriptors / Indicators: Grimacing;Guarding;Discomfort;Sore Pain Intervention(s): Monitored during session    Home Living                      Prior Function            PT Goals (current goals can now be found in the care plan section) Progress towards PT goals: Progressing toward goals    Frequency    Min  3X/week      PT Plan Current plan remains appropriate    Co-evaluation              AM-PAC PT "6 Clicks" Mobility   Outcome Measure  Help needed turning from your back to your side while in a flat bed without using bedrails?: None Help needed moving from lying on your back to sitting on the side of a flat bed without using bedrails?: None Help needed moving to and from a bed to a chair (including a wheelchair)?: None Help needed standing up from a chair using your arms (e.g., wheelchair or bedside chair)?: None Help needed to walk in hospital room?: None Help needed climbing 3-5 steps with a railing? : A Little 6 Click Score: 23    End of Session   Activity Tolerance: Patient tolerated treatment well Patient left: in chair;with call bell/phone within reach Nurse Communication: Mobility status PT Visit Diagnosis: Unsteadiness on feet (R26.81);Muscle weakness (generalized) (M62.81);Pain     Time: 9381-0175 PT Time Calculation (min) (ACUTE ONLY): 17 min  Charges:  $Gait Training: 8-22 mins                     Lewis Shock, PT, DPT Acute Rehabilitation Services Pager #: [phone] Office #: [phone]    Iona Hansen 06/19/2020, 9:57 AM

## 2020-06-19 NOTE — Addendum Note (Signed)
Addendum  created 06/19/20 1002 by Adair Laundry, CRNA   Order list changed

## 2020-06-19 NOTE — TOC Initial Note (Addendum)
Transition of Care (TOC) - Initial/Assessment Note  Donn Pierini RN, BSN Transitions of Care Unit 4E- RN Case Manager See Treatment Team for direct phone #    Patient Details  Name: Cynthia Hale MRN: [id number] Date of Birth: 17-Aug-1965  Transition of Care [facility]) CM/SW Contact:    Darrold Span, RN Phone Number: 06/19/2020, 4:36 PM  Clinical Narrative:                 Pt admitted s/p CABGx4 from home, orders placed for HHPT/OT and DME- RW 3n1- CM in to speak with pt at bedside- daughter also present. Discussed transition plans- daughter here from [place]., also has daughter nearby to assist and other family coming from [place] to help after present daughter leaves.  Discussed HH and DME- choice offered with list provided for [facility] choice Per CMS guidelines from medicare.gov website with star ratings (copy placed in shadow chart)- pt agreeable to using in house DME provider [facility], and per pt her first choice for [facility] is [facility] followed by [facility].  Address, phone # and PCP all confirmed with pt in epic.   Call made to [facility] DME line for DME- RW and 3n1- DME to be delivered to room prior to discharge  Call made to [facility] for [facility] referral- however per Amy- they are not in network with BCBS plan - unable to accept.   Call made to [facility] for [facility] referral- awaiting return call for acceptance. - unable to accept due to staffing, Christin Bach   Expected Discharge Plan: Home w Home Health Services Barriers to Discharge: Continued Medical Work up   Patient Goals and CMS Choice Patient states their goals for this hospitalization and ongoing recovery are:: return home CMS Medicare.gov Compare Post Acute Care list provided to:: Patient    Expected Discharge Plan and Services Expected Discharge Plan: Home w Home Health Services   Discharge Planning Services: CM Consult Post Acute Care Choice: Durable Medical Equipment, Home Health Living arrangements for the past 2 months: Apartment                  DME Arranged: 3-N-1, Walker rolling DME Agency: [facility] Date DME Agency Contacted: 06/19/20 Time DME Agency Contacted: [phone] Representative spoke with at DME Agency: Silvio Pate HH Arranged: PT, OT   Date HH Agency Contacted: 06/19/20      Prior Living Arrangements/Services Living arrangements for the past 2 months: Apartment Lives with:: Self Patient language and need for interpreter reviewed:: Yes Do you feel safe going back to the place where you live?: Yes      Need for Family Participation in Patient Care: Yes (Comment) Care giver support system in place?: Yes (comment)   Criminal Activity/Legal Involvement Pertinent to Current Situation/Hospitalization: No - Comment as needed  Activities of Daily Living Home Assistive Devices/Equipment: None ADL Screening (condition at time of admission) Patient's cognitive ability adequate to safely complete daily activities?: Yes Is the patient deaf or have difficulty hearing?: No Does the patient have difficulty seeing, even when wearing glasses/contacts?: Yes Does the patient have difficulty concentrating, remembering, or making decisions?: No Patient able to express need for assistance with ADLs?: No Does the patient have difficulty dressing or bathing?: No Independently performs ADLs?: Yes (appropriate for developmental age) Does the patient have difficulty walking or climbing stairs?: No Weakness of Legs: None Weakness of Arms/Hands: None  Permission Sought/Granted Permission sought to share information with : Facility Industrial/product designer granted to share information with : Yes, Verbal Permission  Granted     Permission granted to share info w AGENCY: HH agencies        Emotional Assessment Appearance:: Appears stated age Attitude/Demeanor/Rapport: Engaged Affect (typically observed): Appropriate, Pleasant Orientation: : Oriented to Self, Oriented to Place, Oriented to  Time, Oriented to  Situation Alcohol / Substance Use: Not Applicable Psych Involvement: No (comment)  Admission diagnosis:  Acute coronary syndrome (HCC) [I24.9] Atherosclerotic heart disease of native coronary artery with unstable angina pectoris (HCC) [I25.110] Coronary artery disease [I25.10] Patient Active Problem List   Diagnosis Date Noted  . 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 Coronary artery disease 06/14/2020  . Chest pain, precordial 06/12/2020    Class: Acute  . 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 Hyperlipidemia 06/12/2020    Class: Chronic  . Acute coronary syndrome (HCC) 06/12/2020   PCP:  Carron Curie, MD Pharmacy:   [id number] - [place], [place] - [address] [place] [address] Phone: [phone] Fax: [phone]     Social Determinants of Health (SDOH) Interventions    Readmission Risk Interventions No flowsheet data found.

## 2020-06-19 NOTE — Progress Notes (Signed)
Inpatient Diabetes Program Recommendations  AACE/ADA: New Consensus Statement on Inpatient Glycemic Control (2015)  Target Ranges:  Prepandial:   less than 140 mg/dL      Peak postprandial:   less than 180 mg/dL (1-2 hours)      Critically ill patients:  140 - 180 mg/dL   Lab Results  Component Value Date   GLUCAP 261 (H) 06/19/2020   HGBA1C 9.5 (H) 06/12/2020    Review of Glycemic Control Results for Cynthia Hale, Cynthia Hale (MRN [id number]) as of 06/19/2020 11:58  Ref. Range 06/18/2020 07:35 06/18/2020 11:43 06/18/2020 17:13 06/18/2020 21:15 06/19/2020 06:18 06/19/2020 10:57  Glucose-Capillary Latest Ref Range: 70 - 99 mg/dL 177 (H) 116 (H) 579 (H) 188 (H) 160 (H) 261 (H)   Diabetes history: DM 2 Outpatient Diabetes medications:  Lantus 20 units daily, Metformin 500 mg bid Current orders for Inpatient glycemic control:  TCTS tid with meals and HS Inpatient Diabetes Program Recommendations:   Recommend adding Lantus 10 units daily.  Also please reduce Novolog correction to moderate tid with meals and bedtime scale.  Add Novolog meal coverage 3 units tid with meals (hold if patient eats less than 50%).   Thanks,  Beryl Meager, RN, BC-ADM Inpatient Diabetes Coordinator Pager [phone] (8a-5p)

## 2020-06-19 NOTE — Consult Note (Signed)
Ref: Carron Curie, MD   Subjective:  Feeling better. Awaiting carotid angiogram for 80 % stenosis Off pacemaker dependence. HR in 70's.  Objective:  Vital Signs in the last 24 hours: Temp:  [98.1 F (36.7 C)-99 F (37.2 C)] 99 F (37.2 C) (11/29 0739) Pulse Rate:  [68-70] 70 (11/29 0739) Cardiac Rhythm: Atrial paced (11/29 0753) Resp:  [15-22] 18 (11/29 0739) BP: (114-147)/(58-67) 147/64 (11/29 0739) SpO2:  [92 %-99 %] 93 % (11/29 0739) Weight:  [75.8 kg] 75.8 kg (11/29 0636)  Physical Exam: BP Readings from Last 1 Encounters:  06/19/20 (!) 147/64     Wt Readings from Last 1 Encounters:  06/19/20 75.8 kg    Weight change: -2.104 kg Body mass index is 29.6 kg/m. HEENT: [place]/AT, Eyes-Blue, Conjunctiva-Pale pink, Sclera-Non-icteric Neck: No JVD, No bruit, Trachea midline. Lungs:  Clear, Bilateral. Cardiac:  Regular rhythm, normal S1 and S2, no S3. II/VI systolic murmur. Abdomen:  Soft, non-tender. BS present. Extremities:  No edema present. No cyanosis. No clubbing. CNS: AxOx3, Cranial nerves grossly intact, moves all 4 extremities.  Skin: Warm and dry.   Intake/Output from previous day: 11/28 0701 - 11/29 0700 In: 840 [P.O.:840] Out: 3400 [Urine:3400]    Lab Results: BMET    Component Value Date/Time   NA 137 06/19/2020 0641   NA 139 06/18/2020 0346   NA 138 06/17/2020 0348   K 3.9 06/19/2020 0641   K 4.1 06/18/2020 0346   K 3.8 06/17/2020 0348   CL 96 (L) 06/19/2020 0641   CL 100 06/18/2020 0346   CL 103 06/17/2020 0348   CO2 28 06/19/2020 0641   CO2 29 06/18/2020 0346   CO2 27 06/17/2020 0348   GLUCOSE 170 (H) 06/19/2020 0641   GLUCOSE 148 (H) 06/18/2020 0346   GLUCOSE 105 (H) 06/17/2020 0348   BUN 13 06/19/2020 0641   BUN 13 06/18/2020 0346   BUN 15 06/17/2020 0348   CREATININE 1.02 (H) 06/19/2020 0641   CREATININE 0.99 06/18/2020 0346   CREATININE 0.96 06/17/2020 0348   CALCIUM 9.3 06/19/2020 0641   CALCIUM 8.6 (L) 06/18/2020 0346   CALCIUM 8.4  (L) 06/17/2020 0348   GFRNONAA >60 06/19/2020 0641   GFRNONAA >60 06/18/2020 0346   GFRNONAA >60 06/17/2020 0348   GFRAA >60 06/08/2017 1649   CBC    Component Value Date/Time   WBC 8.6 06/19/2020 0641   RBC 3.55 (L) 06/19/2020 0641   HGB 10.8 (L) 06/19/2020 0641   HCT 31.7 (L) 06/19/2020 0641   PLT 400 06/19/2020 0641   MCV 89.3 06/19/2020 0641   MCH 30.4 06/19/2020 0641   MCHC 34.1 06/19/2020 0641   RDW 13.0 06/19/2020 0641   LYMPHSABS 2.6 06/12/2020 1456   MONOABS 0.4 06/12/2020 1456   EOSABS 0.0 06/12/2020 1456   BASOSABS 0.1 06/12/2020 1456   HEPATIC Function Panel Recent Labs    06/12/20 1456 06/15/20 1615  PROT 6.7 5.7*   HEMOGLOBIN A1C No components found for: HGA1C,  MPG CARDIAC ENZYMES No results found for: CKTOTAL, CKMB, CKMBINDEX, TROPONINI BNP No results for input(s): PROBNP in the last 8760 hours. TSH No results for input(s): TSH in the last 8760 hours. CHOLESTEROL Recent Labs    06/13/20 0148  CHOL 187    Scheduled Meds: . acetaminophen  1,000 mg Oral Q6H   Or  . acetaminophen (TYLENOL) oral liquid 160 mg/5 mL  1,000 mg Per Tube Q6H  . aspirin EC  81 mg Oral Daily  . bisacodyl  10  mg Oral Daily   Or  . bisacodyl  10 mg Rectal Daily  . bisacodyl  10 mg Rectal Once  . Chlorhexidine Gluconate Cloth  6 each Topical Daily  . clopidogrel  75 mg Oral Daily  . colchicine  0.3 mg Oral BID  . docusate sodium  200 mg Oral Daily  . ferrous fumarate-b12-vitamic C-folic acid  1 capsule Oral BID PC  . furosemide  40 mg Oral Daily  . insulin aspart  0-24 Units Subcutaneous TID WC & HS  . isosorbide dinitrate  20 mg Oral TID  . metoCLOPramide (REGLAN) injection  5 mg Intravenous Q12H  . pantoprazole  40 mg Oral Daily  . pneumococcal 23 valent vaccine  0.5 mL Intramuscular Tomorrow-1000  . potassium chloride  20 mEq Oral Daily  . rosuvastatin  20 mg Oral Daily  . sodium chloride flush  3 mL Intravenous Q12H   Continuous Infusions: . lactated ringers      PRN Meds:.dextrose, lactated ringers, levalbuterol, metoprolol tartrate, ondansetron (ZOFRAN) IV, oxyCODONE, traMADol  Assessment/Plan: CABG day 5 CAD S/P LCx stent Anemia of blood loss Type 2 DM Hyperlipidemia Carotid artery stenosis  Appreciate vascular surgery evaluation and future treatment. Increase activity as tolerated. F/u in 1 week in office. Small dose hydralazine to improve heart rate.   LOS: 6 days   Time spent including chart review, lab review, examination, discussion with patient/PA : 30 min   [name]  MD  06/19/2020, 9:06 AM

## 2020-06-20 LAB — GLUCOSE, CAPILLARY
Glucose-Capillary: 169 mg/dL — ABNORMAL HIGH (ref 70–99)
Glucose-Capillary: 170 mg/dL — ABNORMAL HIGH (ref 70–99)

## 2020-06-20 MED ORDER — LANTUS SOLOSTAR 100 UNIT/ML ~~LOC~~ SOPN
PEN_INJECTOR | SUBCUTANEOUS | 11 refills | Status: DC
Start: 2020-06-20 — End: 2021-06-28

## 2020-06-20 MED ORDER — CLOPIDOGREL BISULFATE 75 MG PO TABS
75.0000 mg | ORAL_TABLET | Freq: Every day | ORAL | 1 refills | Status: DC
Start: 2020-06-21 — End: 2023-01-15

## 2020-06-20 MED ORDER — LISINOPRIL 5 MG PO TABS
5.0000 mg | ORAL_TABLET | Freq: Every day | ORAL | 1 refills | Status: DC
Start: 2020-06-20 — End: 2023-01-15

## 2020-06-20 MED ORDER — COLCHICINE 0.6 MG PO TABS
0.3000 mg | ORAL_TABLET | Freq: Two times a day (BID) | ORAL | 0 refills | Status: DC
Start: 2020-06-20 — End: 2021-06-28

## 2020-06-20 MED ORDER — OXYCODONE HCL 5 MG PO TABS: 5.0000 mg | ORAL_TABLET | Freq: Four times a day (QID) | ORAL | 0 refills | Status: DC | PRN

## 2020-06-20 NOTE — TOC Transition Note (Signed)
Transition of Care (TOC) - CM/SW Discharge Note Donn Pierini RN, BSN Transitions of Care Unit 4E- RN Case Manager See Treatment Team for direct phone #    Patient Details  Name: Cynthia Hale MRN: [id number] Date of Birth: 1966/02/22  Transition of Care [facility]) CM/SW Contact:  Darrold Span, RN Phone Number: 06/20/2020, 12:50 PM   Clinical Narrative:    Pt stable for transition home today, per updated PT note pt no longer needs HH f/u at home and will await outpt cardiac rehab. Pt also does not need any DME at this time- call made to [facility] to cancel DME order for RW/3n1.  Pt to return home with daughter to assist.    Final next level of care: Home/Self Care Barriers to Discharge: Barriers Resolved   Patient Goals and CMS Choice Patient states their goals for this hospitalization and ongoing recovery are:: return home CMS Medicare.gov Compare Post Acute Care list provided to:: Patient    Discharge Placement               Home        Discharge Plan and Services   Discharge Planning Services: CM Consult Post Acute Care Choice: Durable Medical Equipment, Home Health          DME Arranged: Patient refused services DME Agency: [facility] Date DME Agency Contacted: 06/19/20 Time DME Agency Contacted: [phone] Representative spoke with at DME Agency: Silvio Pate HH Arranged: Patient Refused HH (no longer recommended) HH Agency: NA Date HH Agency Contacted: 06/19/20      Social Determinants of Health (SDOH) Interventions     Readmission Risk Interventions Readmission Risk Prevention Plan 06/20/2020  Post Dischage Appt Complete  Medication Screening Complete  Transportation Screening Complete  Some recent data might be hidden

## 2020-06-20 NOTE — Progress Notes (Signed)
   VASCULAR SURGERY ASSESSMENT & PLAN:   RIGHT CAROTID STENOSIS: I reviewed her CT scan of the neck.  The stenosis on the right is only 60% approximately.  Thus I will simply arrange a follow-up duplex scan in 6 months.  I will see her in the office at that time.  We have discussed exercise and nutrition.  She is on aspirin and is on a statin.  SUBJECTIVE:   No complaints.  PHYSICAL EXAM:   Vitals:   06/19/20 2100 06/19/20 2337 06/20/20 0424 06/20/20 0622  BP: (!) 151/74 (!) 140/58 (!) 142/66   Pulse: 79 77 70   Resp: 18 18 15    Temp: 98.9 F (37.2 C) 99.3 F (37.4 C) 98.9 F (37.2 C)   TempSrc: Oral Oral Oral   SpO2: 96% 93% 94%   Weight:    75.6 kg  Height:       NEURO: Intact.  LABS:   Lab Results  Component Value Date   WBC 8.6 06/19/2020   HGB 10.8 (L) 06/19/2020   HCT 31.7 (L) 06/19/2020   MCV 89.3 06/19/2020   PLT 400 06/19/2020   Lab Results  Component Value Date   CREATININE 1.02 (H) 06/19/2020   Lab Results  Component Value Date   INR 1.3 (H) 06/14/2020   CBG (last 3)  Recent Labs    06/19/20 1617 06/19/20 2152 06/20/20 0621  GLUCAP 105* 228* 169*    PROBLEM LIST:    Principal Problem:   Atherosclerotic heart disease of native coronary artery with unstable angina pectoris (HCC) Active Problems:   Chest pain, precordial   Hyperlipidemia   Acute coronary syndrome (HCC)   S/P CABG x 4   Coronary artery disease   CURRENT MEDS:   . aspirin EC  81 mg Oral Daily  . bisacodyl  10 mg Oral Daily   Or  . bisacodyl  10 mg Rectal Daily  . bisacodyl  10 mg Rectal Once  . Chlorhexidine Gluconate Cloth  6 each Topical Daily  . clopidogrel  75 mg Oral Daily  . colchicine  0.3 mg Oral BID  . docusate sodium  200 mg Oral Daily  . ferrous fumarate-b12-vitamic C-folic acid  1 capsule Oral BID PC  . furosemide  40 mg Oral Daily  . hydrALAZINE  10 mg Oral BID  . insulin aspart  0-15 Units Subcutaneous TID WC  . insulin aspart  3 Units Subcutaneous  TID WC  . insulin glargine  10 Units Subcutaneous Daily  . isosorbide dinitrate  20 mg Oral TID  . metoCLOPramide (REGLAN) injection  5 mg Intravenous Q12H  . pantoprazole  40 mg Oral Daily  . pneumococcal 23 valent vaccine  0.5 mL Intramuscular Tomorrow-1000  . potassium chloride  20 mEq Oral Daily  . rosuvastatin  20 mg Oral Daily  . sodium chloride flush  3 mL Intravenous Q12H    06/22/20 Office: [phone] 06/20/2020

## 2020-06-20 NOTE — Consult Note (Signed)
[name], MD   Subjective:  Awake. VS stable. Sternal wires pulled. Monitor: Sinus rhythm.  Tolerating small dose hydralazine with BP 133/70. Carotid arteries 50 to 55 % stenosed bilaterally.  Objective:  Vital Signs in the last 24 hours: Temp:  [98.7 F (37.1 C)-99.3 F (37.4 C)] 98.7 F (37.1 C) (11/30 0804) Pulse Rate:  [70-79] 70 (11/30 0424) Cardiac Rhythm: Normal sinus rhythm (11/30 0700) Resp:  [15-31] 31 (11/30 0845) BP: (121-151)/(58-74) 133/70 (11/30 0915) SpO2:  [92 %-96 %] 93 % (11/30 0915) Weight:  [75.6 kg] 75.6 kg (11/30 0622)  Physical Exam: BP Readings from Last 1 Encounters:  06/20/20 133/70     Wt Readings from Last 1 Encounters:  06/20/20 75.6 kg    Weight change: -0.181 kg Body mass index is 29.53 kg/m. HEENT: [place]/AT, Eyes-Blue, Conjunctiva-Pale pink, Sclera-Non-icteric Neck: No JVD, No bruit, Trachea midline. Lungs:  Clearing, Bilateral. Cardiac:  Regular rhythm, normal S1 and S2, no S3. II/VI systolic murmur. Abdomen:  Soft, non-tender. BS present. Extremities:  No edema present. No cyanosis. No clubbing. CNS: AxOx3, Cranial nerves grossly intact, moves all 4 extremities.  Skin: Warm and dry.   Intake/Output from previous day: 11/29 0701 - 11/30 0700 In: -  Out: 600 [Urine:600]    Lab Results: BMET    Component Value Date/Time   NA 137 06/19/2020 0641   NA 139 06/18/2020 0346   NA 138 06/17/2020 0348   K 3.9 06/19/2020 0641   K 4.1 06/18/2020 0346   K 3.8 06/17/2020 0348   CL 96 (L) 06/19/2020 0641   CL 100 06/18/2020 0346   CL 103 06/17/2020 0348   CO2 28 06/19/2020 0641   CO2 29 06/18/2020 0346   CO2 27 06/17/2020 0348   GLUCOSE 170 (H) 06/19/2020 0641   GLUCOSE 148 (H) 06/18/2020 0346   GLUCOSE 105 (H) 06/17/2020 0348   BUN 13 06/19/2020 0641   BUN 13 06/18/2020 0346   BUN 15 06/17/2020 0348   CREATININE 1.02 (H) 06/19/2020 0641   CREATININE 0.99 06/18/2020 0346   CREATININE 0.96 06/17/2020 0348   CALCIUM 9.3  06/19/2020 0641   CALCIUM 8.6 (L) 06/18/2020 0346   CALCIUM 8.4 (L) 06/17/2020 0348   GFRNONAA >60 06/19/2020 0641   GFRNONAA >60 06/18/2020 0346   GFRNONAA >60 06/17/2020 0348   GFRAA >60 06/08/2017 1649   CBC    Component Value Date/Time   WBC 8.6 06/19/2020 0641   RBC 3.55 (L) 06/19/2020 0641   HGB 10.8 (L) 06/19/2020 0641   HCT 31.7 (L) 06/19/2020 0641   PLT 400 06/19/2020 0641   MCV 89.3 06/19/2020 0641   MCH 30.4 06/19/2020 0641   MCHC 34.1 06/19/2020 0641   RDW 13.0 06/19/2020 0641   LYMPHSABS 2.6 06/12/2020 1456   MONOABS 0.4 06/12/2020 1456   EOSABS 0.0 06/12/2020 1456   BASOSABS 0.1 06/12/2020 1456   HEPATIC Function Panel Recent Labs    06/12/20 1456 06/15/20 1615  PROT 6.7 5.7*   HEMOGLOBIN A1C No components found for: HGA1C,  MPG CARDIAC ENZYMES No results found for: CKTOTAL, CKMB, CKMBINDEX, TROPONINI BNP No results for input(s): PROBNP in the last 8760 hours. TSH No results for input(s): TSH in the last 8760 hours. CHOLESTEROL Recent Labs    06/13/20 0148  CHOL 187    Scheduled Meds: . aspirin EC  81 mg Oral Daily  . bisacodyl  10 mg Oral Daily   Or  . bisacodyl  10 mg Rectal Daily  .  bisacodyl  10 mg Rectal Once  . Chlorhexidine Gluconate Cloth  6 each Topical Daily  . clopidogrel  75 mg Oral Daily  . colchicine  0.3 mg Oral BID  . docusate sodium  200 mg Oral Daily  . ferrous fumarate-b12-vitamic C-folic acid  1 capsule Oral BID PC  . furosemide  40 mg Oral Daily  . hydrALAZINE  10 mg Oral BID  . insulin aspart  0-15 Units Subcutaneous TID WC  . insulin aspart  3 Units Subcutaneous TID WC  . insulin glargine  10 Units Subcutaneous Daily  . isosorbide dinitrate  20 mg Oral TID  . metoCLOPramide (REGLAN) injection  5 mg Intravenous Q12H  . pantoprazole  40 mg Oral Daily  . pneumococcal 23 valent vaccine  0.5 mL Intramuscular Tomorrow-1000  . potassium chloride  20 mEq Oral Daily  . rosuvastatin  20 mg Oral Daily  . sodium chloride  flush  3 mL Intravenous Q12H   Continuous Infusions: . lactated ringers     PRN Meds:.dextrose, lactated ringers, levalbuterol, metoprolol tartrate, ondansetron (ZOFRAN) IV, oxyCODONE, traMADol  Assessment/Plan: CABG day 6 CAD S/P LCx stent Anemia of blood loss Type 2 DM Hyperlipidemia Carotid artery stenosis  Continue medical treatment. F/U in 1-2 weeks.   LOS: 7 days   Time spent including chart review, lab review, examination, discussion with patient : 30 min   [name]  MD  06/20/2020, 11:07 AM

## 2020-06-20 NOTE — Progress Notes (Signed)
      [address]       [place] [address]             [phone]      6 Days Post-Op Procedure(s) (LRB): CORONARY ARTERY BYPASS GRAFTING (CABG) TIMES FOUR USING LIMA TO LAD;RIMA TO OM1; RADIAL ARTERY SEQUENCED DIAG TO OM2 (N/A) RADIAL ARTERY HARVEST (Right) TRANSESOPHAGEAL ECHOCARDIOGRAM (TEE) (N/A) INDOCYANINE GREEN FLUORESCENCE IMAGING (ICG) (N/A) Subjective: Feels good this morning. No complaints.   Objective: Vital signs in last 24 hours: Temp:  [98.3 F (36.8 C)-99.3 F (37.4 C)] 98.9 F (37.2 C) (11/30 0424) Pulse Rate:  [70-79] 70 (11/30 0424) Cardiac Rhythm: Normal sinus rhythm (11/29 2100) Resp:  [15-19] 15 (11/30 0424) BP: (140-159)/(58-74) 142/66 (11/30 0424) SpO2:  [93 %-99 %] 94 % (11/30 0424) Weight:  [75.6 kg] 75.6 kg (11/30 0622)    Intake/Output from previous day: 11/29 0701 - 11/30 0700 In: -  Out: 600 [Urine:600] Intake/Output this shift: No intake/output data recorded.  General appearance: alert, cooperative and no distress Heart: regular rate and rhythm, S1, S2 normal, no murmur, click, rub or gallop Lungs: clear to auscultation bilaterally Abdomen: soft, non-tender; bowel sounds normal; no masses,  no organomegaly Extremities: extremities normal, atraumatic, no cyanosis or edema Wound: clean and dry, staples in place  Lab Results: Recent Labs    06/18/20 0346 06/19/20 0641  WBC 9.5 8.6  HGB 9.6* 10.8*  HCT 29.1* 31.7*  PLT 292 400   BMET:  Recent Labs    06/18/20 0346 06/19/20 0641  NA 139 137  K 4.1 3.9  CL 100 96*  CO2 29 28  GLUCOSE 148* 170*  BUN 13 13  CREATININE 0.99 1.02*  CALCIUM 8.6* 9.3    PT/INR: No results for input(s): LABPROT, INR in the last 72 hours. ABG    Component Value Date/Time   PHART 7.332 (L) 06/14/2020 2308   HCO3 22.1 06/14/2020 2308   TCO2 23 06/14/2020 2308   ACIDBASEDEF 4.0 (H) 06/14/2020 2308   O2SAT 84.0 06/14/2020 2308   CBG (last 3)  Recent Labs    06/19/20 1617  06/19/20 2152 06/20/20 0621  GLUCAP 105* 228* 169*    Assessment/Plan: S/P Procedure(s) (LRB): CORONARY ARTERY BYPASS GRAFTING (CABG) TIMES FOUR USING LIMA TO LAD;RIMA TO OM1; RADIAL ARTERY SEQUENCED DIAG TO OM2 (N/A) RADIAL ARTERY HARVEST (Right) TRANSESOPHAGEAL ECHOCARDIOGRAM (TEE) (N/A) INDOCYANINE GREEN FLUORESCENCE IMAGING (ICG) (N/A)  1. CV- NSR in the 80s, no arrhythmias, holding BB, BP well controlled. Continue asa and statin. Continue isordil for radial artery harvest. Appreciate cardiology assistance 2. Pulm-tolerating 2L  with good oxygen saturation. Similar low lung volumes with bibasilar atelectasis 3. Renal-creatinine 1.02, electrolytes okay. Continue lasix to 40mg  oral daily.  4. H and H 7.7/24.3, slowly been decreased over the last few days. 5. Endo- blood glucose good this morning but at times she does get above 200. Changes made yesterday.   Plan: Remove EPW today. Plan to discharge early this afternoon. Instructions discussed with the patient. All questions answered.    LOS: 7 days    01-04-2001 06/20/2020

## 2020-06-20 NOTE — Progress Notes (Signed)
Epicardial pacing wires discontinued at 0815.  Pt tolerated well.  Bedrest and frequent monitoring until 0915.

## 2020-06-20 NOTE — Progress Notes (Signed)
[id number] Pt on bedrest from pacing wire removal. Stated has been walking. Education completed with pt who voiced understanding. Stressed importance of sternal precautions and staying in the tube. Has handout. Encouraged IS, walking for exercise and CRP 2. Pt has decided she would prefer to attend CRP 2 here since she works in [employer]iological [employer]. Referral to [place] made.  Gave heart healthy and diabetic diets. Discussed carb counting. Discussed wound care. Understanding voiced by pt.  Pt is interested in participating in Virtual Cardiac and Pulmonary Rehab. Pt advised that Virtual Cardiac and Pulmonary Rehab is provided at no cost to the patient.  Checklist:  1. Pt has smart device  ie smartphone and/or ipad for downloading an app  Yes 2. Reliable internet/wifi service    Yes 3. Understands how to use their smartphone and navigate within an app.  Yes   Pt verbalized understanding and is in agreement.

## 2020-06-20 NOTE — Progress Notes (Signed)
Physical Therapy Treatment Patient Details Name: Cynthia Hale MRN: [id number] DOB: 1966/04/12 Today's Date: 06/20/2020    History of Present Illness Pt is a 54 year old woman admitted on 11/24 for CABG x 4. Pt had plans for cholecystectomy, but found to have a + stress test when preparing for procedure. PMH: DM, HLD, HTN, CAD s/p PCI 2 years ago.    PT Comments    Pt is tolerating activity well.  Pt able to perform all functional activities independently and able to voice and maintain sternal precautions.  Pt has support for a few days from her daughters before they live to go back home [place] of state.    Will inform supervising PT of need for change in recommendations at this time.    Follow Up Recommendations  No PT follow up     Equipment Recommendations  None recommended by PT    Recommendations for Other Services       Precautions / Restrictions Precautions Precautions: Sternal Precaution Booklet Issued: No Precaution Comments: cues to generalize sternal precautions Restrictions Weight Bearing Restrictions: Yes (sternal precautions)    Mobility  Bed Mobility                  Transfers                 General transfer comment: Pt standing in room on arrival indepently  Ambulation/Gait Ambulation/Gait assistance: Independent Gait Distance (Feet): 150 Feet Assistive device: None Gait Pattern/deviations: Step-through pattern;WFL(Within Functional Limits) Gait velocity: slow   General Gait Details: No issues in balance remains steady.  Pt with mild DOE.   Stairs Stairs: Yes Stairs assistance: Supervision Stair Management: One rail Left;Step to pattern;Forwards Number of Stairs: 10 General stair comments: Pt reports she feels better today.  No issues or difficulty reported.   Wheelchair Mobility    Modified Rankin (Stroke Patients Only)       Balance Overall balance assessment: Needs assistance Sitting-balance support: No  upper extremity supported Sitting balance-Leahy Scale: Good       Standing balance-Leahy Scale: Fair Standing balance comment: relies on UE support and external support                            Cognition Arousal/Alertness: Awake/alert Behavior During Therapy: WFL for tasks assessed/performed Overall Cognitive Status: Within Functional Limits for tasks assessed                                        Exercises      General Comments        Pertinent Vitals/Pain Pain Assessment: No/denies pain    Home Living                      Prior Function            PT Goals (current goals can now be found in the care plan section) Acute Rehab PT Goals Patient Stated Goal: get stronger Potential to Achieve Goals: Good Progress towards PT goals: Progressing toward goals    Frequency    Min 3X/week      PT Plan Discharge plan needs to be updated    Co-evaluation              AM-PAC PT "6 Clicks" Mobility   Outcome Measure  Help needed turning  from your back to your side while in a flat bed without using bedrails?: None Help needed moving from lying on your back to sitting on the side of a flat bed without using bedrails?: None Help needed moving to and from a bed to a chair (including a wheelchair)?: None Help needed standing up from a chair using your arms (e.g., wheelchair or bedside chair)?: None Help needed to walk in hospital room?: None Help needed climbing 3-5 steps with a railing? : None 6 Click Score: 24    End of Session Equipment Utilized During Treatment: Gait belt Activity Tolerance: Patient tolerated treatment well Patient left: in chair;with call bell/phone within reach Nurse Communication: Mobility status PT Visit Diagnosis: Unsteadiness on feet (R26.81);Muscle weakness (generalized) (M62.81);Pain Pain - part of body:  (incision)     Time: 3810-1751 PT Time Calculation (min) (ACUTE ONLY): 8  min  Charges:  $Gait Training: 8-22 mins                     Bonney Leitz , PTA Acute Rehabilitation Services Pager [phone] Office [phone]     Arley Garant Artis Delay 06/20/2020, 12:08 PM

## 2020-06-23 ENCOUNTER — Other Ambulatory Visit: Payer: Self-pay | Admitting: Cardiothoracic Surgery

## 2020-06-23 DIAGNOSIS — Z951 Presence of aortocoronary bypass graft: Secondary | ICD-10-CM

## 2020-06-26 ENCOUNTER — Ambulatory Visit: Payer: Self-pay | Admitting: Cardiothoracic Surgery

## 2020-06-27 ENCOUNTER — Telehealth (HOSPITAL_COMMUNITY): Payer: Self-pay

## 2020-06-27 NOTE — Telephone Encounter (Signed)
Will verify pt insurance at the beginning of the year. 

## 2020-06-27 NOTE — Telephone Encounter (Signed)
Called patient to see if she is interested in the Cardiac Rehab Program. Patient expressed interest. Explained scheduling process and adv pt we will go over insurance benefits at the first of the year, patient verbalized understanding. Will contact patient for scheduling once f/u has been completed.

## 2020-06-29 ENCOUNTER — Telehealth: Payer: Self-pay | Admitting: *Deleted

## 2020-06-29 NOTE — Telephone Encounter (Signed)
Cynthia Hale called concerned about drainage exuding from the lower aspect of her sternal incision s/p CABG by Dr. Vickey Sages 06/14/20. Pt described drainage as clear yellow that is somewhat blood tinged. Pt sent photos to RN office phone. Photos demonstrate a wet appearing wound that has clear drainage with what appears to be scabs forming. Pt denies fever or puss coming from wound. Wound is approximated with staples. She notes redness around edges of wound but that redness does not spread. Advised pt to clean the wound with peroxide to help remove the caked on drainage, keep the wound clean and dry and to wear loose fitting shirts. Pt has a f/u with Dr. Vickey Sages Monday, December 13. Instructed pt to call if the incision starts to display any s/s of infection. Pt verablizes understanding.

## 2020-07-03 ENCOUNTER — Ambulatory Visit (INDEPENDENT_AMBULATORY_CARE_PROVIDER_SITE_OTHER): Payer: Self-pay | Admitting: Cardiothoracic Surgery

## 2020-07-03 ENCOUNTER — Other Ambulatory Visit: Payer: Self-pay

## 2020-07-03 ENCOUNTER — Encounter: Payer: Self-pay | Admitting: Cardiothoracic Surgery

## 2020-07-03 ENCOUNTER — Ambulatory Visit
Admission: RE | Admit: 2020-07-03 | Discharge: 2020-07-03 | Disposition: A | Discharge status: Home / Self Care | Payer: BC Managed Care – PPO | Source: Ambulatory Visit | Attending: Cardiothoracic Surgery | Admitting: Cardiothoracic Surgery

## 2020-07-03 VITALS — BP 145/71 | HR 93 | Resp 20 | Ht 63.0 in | Wt 161.4 lb

## 2020-07-03 DIAGNOSIS — Z951 Presence of aortocoronary bypass graft: Secondary | ICD-10-CM

## 2020-07-03 MED ORDER — TRAMADOL HCL 50 MG PO TABS
50.0000 mg | ORAL_TABLET | Freq: Four times a day (QID) | ORAL | 0 refills | Status: DC | PRN
Start: 2020-07-03 — End: 2021-06-28

## 2020-07-31 NOTE — Progress Notes (Signed)
The patient presents for routine follow-up status post CABG.  She denies chest pain or shortness of breath.  Denies fevers or chills  BP (!) 145/71 (BP Location: Left Arm, Patient Position: Sitting)   Pulse 93   Resp 20   Ht 5\' 3"  (1.6 m)   Wt 73.2 kg   SpO2 97% Comment: RA with mask on  BMI 28.59 kg/m   Well-appearing no acute distress Clear to auscultation bilaterally Regular rate and rhythm Incisions well-healed  Imaging: Clear lung fields  Impression: Doing well after CABG  Plan: Follow-up with thoracic surgery as needed  Cynthia Hale Z. , MD [phone]

## 2020-08-14 ENCOUNTER — Telehealth (HOSPITAL_COMMUNITY): Payer: Self-pay

## 2020-08-14 NOTE — Telephone Encounter (Signed)
Called and spoke with pt in regards to CR, pt stated she is not interested at this time. She is working out at home.  Closed referral

## 2020-08-15 ENCOUNTER — Other Ambulatory Visit: Payer: Self-pay | Admitting: Physician Assistant

## 2020-08-23 ENCOUNTER — Other Ambulatory Visit: Payer: Self-pay | Admitting: Physician Assistant

## 2020-09-01 ENCOUNTER — Other Ambulatory Visit: Payer: Self-pay

## 2020-09-01 DIAGNOSIS — R0782 Intercostal pain: Secondary | ICD-10-CM

## 2020-09-04 ENCOUNTER — Other Ambulatory Visit: Payer: Self-pay

## 2020-09-04 ENCOUNTER — Ambulatory Visit (INDEPENDENT_AMBULATORY_CARE_PROVIDER_SITE_OTHER): Payer: Self-pay | Admitting: Cardiothoracic Surgery

## 2020-09-04 ENCOUNTER — Ambulatory Visit
Admission: RE | Admit: 2020-09-04 | Discharge: 2020-09-04 | Disposition: A | Discharge status: Home / Self Care | Payer: BC Managed Care – PPO | Source: Ambulatory Visit | Attending: Cardiothoracic Surgery | Admitting: Cardiothoracic Surgery

## 2020-09-04 VITALS — BP 139/87 | HR 75 | Resp 20 | Ht 63.0 in | Wt 163.0 lb

## 2020-09-04 DIAGNOSIS — Z951 Presence of aortocoronary bypass graft: Secondary | ICD-10-CM

## 2020-09-04 DIAGNOSIS — R0782 Intercostal pain: Secondary | ICD-10-CM

## 2020-09-04 MED ORDER — CEPHALEXIN 500 MG PO CAPS: 500.0000 mg | ORAL_CAPSULE | Freq: Four times a day (QID) | ORAL | 0 refills | Status: AC

## 2020-09-04 NOTE — Progress Notes (Signed)
The patient returns for wound check.  She is status post CABG on 06/14/2020.  The patient did well until this past week when she noticed some drainage from the midportion of her incision.  This is described as being preceded by blistering.  She denies any fevers or chills.  Physical exam:  BP 139/87 (BP Location: Right Arm, Patient Position: Sitting)   Pulse 75   Resp 20   Ht 5\' 3"  (1.6 m)   Wt 73.9 kg   SpO2 99% Comment: RA with mask on  BMI 28.87 kg/m   Well-appearing no acute distress Clear to auscultation bilaterally Regular rate and rhythm Wound: Well-healed except in the midportion where there is a small defect that is likely corresponding to a suture abscess.  There is minimal surrounding erythema and no fluctuance  Imaging:  Chest cavities clear bilaterally and a stable mediastinal silhouette  Impression: Likely stitch abscess status post median sternotomy for CABG  Plan: Prescription given for Keflex 500 mg p.o. 4 times per day for 7 days Follow-up as needed. Local wound care including bathing and application of Neosporin as needed  Felicita Nuncio Z. , MD [phone]

## 2020-10-22 ENCOUNTER — Other Ambulatory Visit: Payer: Self-pay

## 2020-10-22 ENCOUNTER — Emergency Department (HOSPITAL_COMMUNITY)
Admission: EM | Admit: 2020-10-22 | Discharge: 2020-10-22 | Disposition: A | Discharge status: Home / Self Care | Payer: BC Managed Care – PPO | Attending: Emergency Medicine | Admitting: Emergency Medicine

## 2020-10-22 ENCOUNTER — Encounter (HOSPITAL_COMMUNITY): Payer: Self-pay | Admitting: Emergency Medicine

## 2020-10-22 ENCOUNTER — Emergency Department (HOSPITAL_COMMUNITY): Payer: BC Managed Care – PPO

## 2020-10-22 DIAGNOSIS — Z79899 Other long term (current) drug therapy: Secondary | ICD-10-CM | POA: Insufficient documentation

## 2020-10-22 DIAGNOSIS — Z7984 Long term (current) use of oral hypoglycemic drugs: Secondary | ICD-10-CM | POA: Insufficient documentation

## 2020-10-22 DIAGNOSIS — Z7982 Long term (current) use of aspirin: Secondary | ICD-10-CM | POA: Diagnosis not present

## 2020-10-22 DIAGNOSIS — R0789 Other chest pain: Secondary | ICD-10-CM | POA: Insufficient documentation

## 2020-10-22 DIAGNOSIS — Z7902 Long term (current) use of antithrombotics/antiplatelets: Secondary | ICD-10-CM | POA: Diagnosis not present

## 2020-10-22 DIAGNOSIS — M79671 Pain in right foot: Secondary | ICD-10-CM | POA: Insufficient documentation

## 2020-10-22 DIAGNOSIS — Y92410 Unspecified street and highway as the place of occurrence of the external cause: Secondary | ICD-10-CM | POA: Insufficient documentation

## 2020-10-22 DIAGNOSIS — I10 Essential (primary) hypertension: Secondary | ICD-10-CM | POA: Diagnosis not present

## 2020-10-22 DIAGNOSIS — Z794 Long term (current) use of insulin: Secondary | ICD-10-CM | POA: Insufficient documentation

## 2020-10-22 DIAGNOSIS — I25110 Atherosclerotic heart disease of native coronary artery with unstable angina pectoris: Secondary | ICD-10-CM | POA: Diagnosis not present

## 2020-10-22 DIAGNOSIS — Z951 Presence of aortocoronary bypass graft: Secondary | ICD-10-CM | POA: Diagnosis not present

## 2020-10-22 DIAGNOSIS — M25511 Pain in right shoulder: Secondary | ICD-10-CM | POA: Diagnosis not present

## 2020-10-22 DIAGNOSIS — E119 Type 2 diabetes mellitus without complications: Secondary | ICD-10-CM | POA: Insufficient documentation

## 2020-10-22 MED ORDER — ACETAMINOPHEN 500 MG PO TABS
1000.0000 mg | ORAL_TABLET | Freq: Once | ORAL | Status: AC
  Administered 2020-10-22: 1000 mg via ORAL
  Filled 2020-10-22: qty 2
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
## 2020-10-22 NOTE — ED Notes (Signed)
ED Provider at bedside. 
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
## 2020-11-26 ENCOUNTER — Emergency Department (HOSPITAL_COMMUNITY): Payer: BC Managed Care – PPO

## 2020-11-26 ENCOUNTER — Other Ambulatory Visit: Payer: Self-pay

## 2020-11-26 ENCOUNTER — Encounter (HOSPITAL_COMMUNITY): Payer: Self-pay | Admitting: Emergency Medicine

## 2020-11-26 ENCOUNTER — Emergency Department (HOSPITAL_COMMUNITY)
Admission: EM | Admit: 2020-11-26 | Discharge: 2020-11-26 | Disposition: A | Discharge status: Home / Self Care | Payer: BC Managed Care – PPO | Attending: Emergency Medicine | Admitting: Emergency Medicine

## 2020-11-26 DIAGNOSIS — I25110 Atherosclerotic heart disease of native coronary artery with unstable angina pectoris: Secondary | ICD-10-CM | POA: Insufficient documentation

## 2020-11-26 DIAGNOSIS — Z794 Long term (current) use of insulin: Secondary | ICD-10-CM | POA: Insufficient documentation

## 2020-11-26 DIAGNOSIS — Z7984 Long term (current) use of oral hypoglycemic drugs: Secondary | ICD-10-CM | POA: Insufficient documentation

## 2020-11-26 DIAGNOSIS — R079 Chest pain, unspecified: Secondary | ICD-10-CM

## 2020-11-26 DIAGNOSIS — I10 Essential (primary) hypertension: Secondary | ICD-10-CM | POA: Insufficient documentation

## 2020-11-26 DIAGNOSIS — Z7982 Long term (current) use of aspirin: Secondary | ICD-10-CM | POA: Diagnosis not present

## 2020-11-26 DIAGNOSIS — Z79899 Other long term (current) drug therapy: Secondary | ICD-10-CM | POA: Diagnosis not present

## 2020-11-26 DIAGNOSIS — Z951 Presence of aortocoronary bypass graft: Secondary | ICD-10-CM | POA: Diagnosis not present

## 2020-11-26 DIAGNOSIS — R0789 Other chest pain: Secondary | ICD-10-CM | POA: Diagnosis not present

## 2020-11-26 DIAGNOSIS — Z20822 Contact with and (suspected) exposure to covid-19: Secondary | ICD-10-CM | POA: Insufficient documentation

## 2020-11-26 DIAGNOSIS — Z7902 Long term (current) use of antithrombotics/antiplatelets: Secondary | ICD-10-CM | POA: Diagnosis not present

## 2020-11-26 LAB — BASIC METABOLIC PANEL
Anion gap: 11 (ref 5–15)
BUN: 19 mg/dL (ref 6–20)
CO2: 24 mmol/L (ref 22–32)
Calcium: 9.7 mg/dL (ref 8.9–10.3)
Chloride: 103 mmol/L (ref 98–111)
Creatinine, Ser: 0.85 mg/dL (ref 0.44–1.00)
GFR, Estimated: >60 mL/min (ref >60)
Glucose, Bld: 178 mg/dL — ABNORMAL HIGH (ref 70–99)
Potassium: 4.1 mmol/L (ref 3.5–5.1)
Sodium: 138 mmol/L (ref 135–145)

## 2020-11-26 LAB — CBC
HCT: 40.8 % (ref 36.0–46.0)
Hemoglobin: 13.3 g/dL (ref 12.0–15.0)
MCH: 28.2 pg (ref 26.0–34.0)
MCHC: 32.6 g/dL (ref 30.0–36.0)
MCV: 86.4 fL (ref 80.0–100.0)
Platelets: 286 10*3/uL (ref 150–400)
RBC: 4.72 MIL/uL (ref 3.87–5.11)
RDW: 14.7 % (ref 11.5–15.5)
WBC: 9.2 10*3/uL (ref 4.0–10.5)
nRBC: 0 % (ref 0.0–0.2)

## 2020-11-26 LAB — RESP PANEL BY RT-PCR (FLU A&B, COVID) ARPGX2
Influenza A by PCR: NEGATIVE
Influenza B by PCR: NEGATIVE
SARS Coronavirus 2 by RT PCR: NEGATIVE

## 2020-11-26 LAB — HEPATIC FUNCTION PANEL
ALT: 22 U/L (ref 0–44)
AST: 20 U/L (ref 15–41)
Albumin: 4.2 g/dL (ref 3.5–5.0)
Alkaline Phosphatase: 66 U/L (ref 38–126)
Bilirubin, Direct: 0.1 mg/dL (ref 0.0–0.2)
Indirect Bilirubin: 0.7 mg/dL (ref 0.3–0.9)
Total Bilirubin: 0.8 mg/dL (ref 0.3–1.2)
Total Protein: 7.8 g/dL (ref 6.5–8.1)

## 2020-11-26 LAB — TROPONIN I (HIGH SENSITIVITY)
Troponin I (High Sensitivity): 10 ng/L (ref <18)
Troponin I (High Sensitivity): 10 ng/L (ref <18)

## 2020-11-26 LAB — LIPASE, BLOOD: Lipase: 33 U/L (ref 11–51)

## 2020-11-26 LAB — I-STAT BETA HCG BLOOD, ED (MC, WL, AP ONLY): I-stat hCG, quantitative: <5 m[IU]/mL (ref <5)

## 2020-11-26 MED ORDER — IBUPROFEN 800 MG PO TABS
800.0000 mg | ORAL_TABLET | Freq: Once | ORAL | Status: AC
  Administered 2020-11-26: 800 mg via ORAL
  Filled 2020-11-26: qty 1

## 2020-11-26 MED ORDER — IOHEXOL 350 MG/ML SOLN
100.0000 mL | Freq: Once | INTRAVENOUS | Status: AC | PRN
  Administered 2020-11-26: 100 mL via INTRAVENOUS

## 2020-11-26 MED ORDER — FUROSEMIDE 20 MG PO TABS
20.0000 mg | ORAL_TABLET | Freq: Once | ORAL | Status: AC
  Administered 2020-11-26: 20 mg via ORAL
  Filled 2020-11-26: qty 1

## 2020-11-26 MED ORDER — NITROGLYCERIN 0.4 MG SL SUBL
0.4000 mg | SUBLINGUAL_TABLET | SUBLINGUAL | Status: DC | PRN
  Administered 2020-11-26 (×2): 0.4 mg via SUBLINGUAL
  Filled 2020-11-26: qty 1

## 2020-11-26 NOTE — ED Triage Notes (Signed)
C/o intermittent pain to middle of chest since Friday with SOB, nausea, and vomiting.  Denies pain at present.

## 2020-11-26 NOTE — Discharge Instructions (Addendum)
Please follow-up with Dr. Algie Coffer as discussed Return to the emergency department if you are worse or having new symptoms at any time.

## 2020-11-26 NOTE — ED Provider Notes (Signed)
[facility] EMERGENCY DEPARTMENT Provider Note   CSN: [id number] Arrival date & time: 11/26/20  4503     History Chief Complaint  Patient presents with  . Chest Pain    Cynthia Hale is a 55 y.o. female.  HPI  HPI: A 55 year old patient with a history of treated diabetes, hypertension and obesity presents for evaluation of chest pain. Initial onset of pain was approximately 1-3 hours ago. The patient's chest pain is described as heaviness/pressure/tightness and is worse with exertion. The patient complains of nausea. The patient's chest pain is middle- or left-sided, is not well-localized, is not sharp and does radiate to the arms/jaw/neck. The patient denies diaphoresis. The patient has no history of stroke, has no history of peripheral artery disease, has not smoked in the past 90 days, has no relevant family history of coronary artery disease (first degree relative at less than age 8) and has no history of hypercholesterolemia.  54 year old female history of coronary artery disease, status post Endovascular 21 presents today complaining of substernal chest pain.  Symptoms began on Friday.  NephPlex and weakness.  They are worse with exertion and improved with rest. Past Medical History:  Diagnosis Date  . Diabetes mellitus without complication (HCC)   . Hypertension   . Vitamin D deficiency     Patient Active Problem List   Diagnosis Date Noted  . 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 Coronary artery disease 06/14/2020  . Chest pain, precordial 06/12/2020    Class: Acute  . 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 Hyperlipidemia 06/12/2020    Class: Chronic  . Acute coronary syndrome (HCC) 06/12/2020    Past Surgical History:  Procedure Laterality Date  . CORONARY ARTERY BYPASS GRAFT N/A 06/14/2020   Procedure: CORONARY ARTERY BYPASS GRAFTING (CABG) TIMES FOUR USING LIMA TO LAD;RIMA  TO OM1; RADIAL ARTERY SEQUENCED DIAG TO OM2;  Surgeon: Linden Dolin, MD;  Location: [facility] OR;  Service: Open Heart Surgery;  Laterality: N/A;  Bilateral IMA  . LEFT HEART CATH AND CORONARY ANGIOGRAPHY N/A 06/13/2020   Procedure: LEFT HEART CATH AND CORONARY ANGIOGRAPHY;  Surgeon: [name], MD;  Location: [facility] INVASIVE CV LAB;  Service: Cardiovascular;  Laterality: N/A;  . RADIAL ARTERY HARVEST Right 06/14/2020   Procedure: RADIAL ARTERY HARVEST;  Surgeon: Linden Dolin, MD;  Location: [facility] OR;  Service: Open Heart Surgery;  Laterality: Right;  . TEE WITHOUT CARDIOVERSION N/A 06/14/2020   Procedure: TRANSESOPHAGEAL ECHOCARDIOGRAM (TEE);  Surgeon: Linden Dolin, MD;  Location: [facility] OR;  Service: Open Heart Surgery;  Laterality: N/A;     OB History   No obstetric history on file.     History reviewed. No pertinent family history.  Social History   Tobacco Use  . Smoking status: Never Smoker  . Smokeless tobacco: Never Used  Substance Use Topics  . Alcohol use: Yes    Comment: occ   . Drug use: No    Home Medications Prior to Admission medications   Medication Sig Start Date End Date Taking? Authorizing Provider  acetaminophen (TYLENOL) 325 MG tablet Take 325-650 mg by mouth every 6 (six) hours as needed for mild pain (or headaches).    [provider]  aspirin 81 MG chewable tablet Chew 81 mg by mouth daily.    [provider]  clopidogrel (PLAVIX) 75 MG tablet Take 1 tablet (75 mg total) by mouth daily. 06/21/20  Sharlene Dory, PA-C  colchicine 0.6 MG tablet Take 0.5 tablets (0.3 mg total) by mouth 2 (two) times daily. 06/20/20   Sharlene Dory, PA-C  ergocalciferol (VITAMIN D2) 1.25 MG (50000 UT) capsule Take 50,000 Units by mouth once a week.    [provider]  insulin glargine (LANTUS SOLOSTAR) 100 UNIT/ML Solostar Pen Inject 10 units into the skin in the morning after breakfast and in the evening, after supper/evening meal 06/20/20   Asa Lente,  Tessa N, PA-C  lisinopril (ZESTRIL) 5 MG tablet Take 1 tablet (5 mg total) by mouth daily. 06/20/20   Sharlene Dory, PA-C  metFORMIN (GLUCOPHAGE) 500 MG tablet Take 500 mg by mouth 2 (two) times daily. 04/29/20   [provider]  Metoprolol Succinate 25 MG CS24 Take by mouth.    [provider]  oxyCODONE (OXY IR/ROXICODONE) 5 MG immediate release tablet Take 1 tablet (5 mg total) by mouth every 6 (six) hours as needed for severe pain. 06/20/20   Sharlene Dory, PA-C  rosuvastatin (CRESTOR) 20 MG tablet Take 20 mg by mouth daily.  03/25/20   [provider]  Semaglutide (RYBELSUS) 7 MG TABS Take 7 mg by mouth daily.    [provider]  traMADol (ULTRAM) 50 MG tablet Take 1 tablet (50 mg total) by mouth every 6 (six) hours as needed. 07/03/20 07/03/21  Linden Dolin, MD    Allergies    Darvon [propoxyphene] and Tape  Review of Systems   Review of Systems  All other systems reviewed and are negative.   Physical Exam Updated Vital Signs BP (!) 182/75 (BP Location: Right Arm)   Pulse 63   Temp 97.7 F (36.5 C) (Oral)   Resp 16   SpO2 100%   Physical Exam Vitals and nursing note reviewed.  Constitutional:      Appearance: She is well-developed.  HENT:     Head: Normocephalic and atraumatic.     Right Ear: External ear normal.     Left Ear: External ear normal.     Nose: Nose normal.  Eyes:     Conjunctiva/sclera: Conjunctivae normal.     Pupils: Pupils are equal, round, and reactive to light.  Cardiovascular:     Rate and Rhythm: Normal rate and regular rhythm.     Heart sounds: Normal heart sounds.  Pulmonary:     Effort: Pulmonary effort is normal.     Breath sounds: Normal breath sounds.  Abdominal:     General: Bowel sounds are normal.     Palpations: Abdomen is soft.  Musculoskeletal:        General: Normal range of motion.     Cervical back: Normal range of motion and neck supple.  Skin:    General: Skin is warm and dry.   Neurological:     Mental Status: She is alert and oriented to person, place, and time.     Deep Tendon Reflexes: Reflexes are normal and symmetric.  Psychiatric:        Behavior: Behavior normal.        Thought Content: Thought content normal.        Judgment: Judgment normal.     ED Results / Procedures / Treatments   Labs (all labs ordered are listed, but only abnormal results are displayed) Labs Reviewed  BASIC METABOLIC PANEL  CBC  I-STAT BETA HCG BLOOD, ED ([facility], [facility], [facility] ONLY)  TROPONIN I (HIGH SENSITIVITY)    EKG EKG Interpretation  Date/Time:  Sunday Nov 26 2020 08:47:05 EDT Ventricular Rate:  63 PR Interval:  150 QRS Duration: 118 QT Interval:  456 QTC Calculation: 466 R Axis:   81 Text Interpretation: Normal sinus rhythm Incomplete right bundle branch block Right ventricular hypertrophy with repolarization abnormality Cannot rule out Inferior infarct , age undetermined Abnormal ECG Confirmed by Margarita Grizzle [phone]) on 11/26/2020 8:57:44 AM   Radiology CT Angio Chest PE W and/or Wo Contrast  Result Date: 11/26/2020 CLINICAL DATA:  Chest and epigastric pain. History of gallstones and prior open heart surgery. Some clinical concern for pulmonary embolism given positive D-dimer. EXAM: CT ANGIOGRAPHY CHEST CT ABDOMEN AND PELVIS WITH CONTRAST TECHNIQUE: Multidetector CT imaging of the chest was performed using the standard protocol during bolus administration of intravenous contrast. Multiplanar CT image reconstructions and MIPs were obtained to evaluate the vascular anatomy. Multidetector CT imaging of the abdomen and pelvis was performed using the standard protocol during bolus administration of intravenous contrast. CONTRAST:  OMNIPAQUE IOHEXOL 350 MG/ML SOLN COMPARISON:  None. FINDINGS: CTA CHEST FINDINGS Cardiovascular: Satisfactory opacification of the pulmonary arteries to the segmental level. No evidence of pulmonary embolism. Normal heart size. No pericardial  effusion. Surgical changes of prior multivessel CABG. Calcifications present along the native coronary arteries. Mediastinum/Nodes: Unremarkable CT appearance of the thyroid gland. No suspicious mediastinal or hilar adenopathy. No soft tissue mediastinal mass. The thoracic esophagus is unremarkable. Lungs/Pleura: Mosaic attenuation throughout the lungs with areas of mild ground-glass attenuation airspace opacity interspersed with areas of relative lucency and possible hyperinflation. No focal airspace infiltrate, pulmonary edema, pleural effusion or pneumothorax. Musculoskeletal: No acute fracture or aggressive appearing lytic or blastic osseous lesion. Nonunion of median sternotomy defect. Cerclage wires remain intact. Review of the MIP images confirms the above findings. CT ABDOMEN and PELVIS FINDINGS Hepatobiliary: Normal hepatic contour and morphology. No discrete hepatic lesion or evidence of biliary ductal dilatation. Numerous calcified faceted stones present within the gallbladder lumen. No gallbladder distension, wall thickening or pericholecystic fluid. Pancreas: Unremarkable. No pancreatic ductal dilatation or surrounding inflammatory changes. Spleen: Normal in size without focal abnormality. Adrenals/Urinary Tract: Normal adrenal glands. Renal size discrepancy with the right kidney smaller than the left. No enhancing renal lesion. Small circumscribed renal cyst endophytic in the interpolar left kidney. Unremarkable ureters and bladder. Stomach/Bowel: Stomach is within normal limits. Appendix appears normal. No evidence of bowel wall thickening, distention, or inflammatory changes. Vascular/Lymphatic: Atherosclerotic calcifications throughout the abdominal aorta. No evidence of aneurysm or dissection. No suspicious lymphadenopathy. Reproductive: Uterus and bilateral adnexa are unremarkable. Other: No abdominal wall hernia or abnormality. No abdominopelvic ascites. Musculoskeletal: No acute or significant  osseous findings. Review of the MIP images confirms the above findings. IMPRESSION: CTA CHEST 1. Negative for acute pulmonary embolus. 2. Mosaic attenuation throughout the lungs with areas of ground-glass attenuation airspace opacity interspersed with areas of air trapping. Findings are nonspecific. Favor small airways disease. 3. Nonunion of median sternotomy defect. Cerclage wires remain intact. 4. Surgical changes of prior multivessel CABG. CT ABD/PELVIS 1. No acute abnormality within the abdomen or pelvis. 2. Cholelithiasis without evidence of acute cholecystitis. 3. Renal size discrepancy with the right kidney noticeably smaller than the left. 4.  Aortic Atherosclerosis (ICD10-I70.0). Electronically Signed   By: Malachy Moan M.D.   On: 11/26/2020 12:31   CT ABDOMEN PELVIS W CONTRAST  Result Date: 11/26/2020 CLINICAL DATA:  Chest and epigastric pain. History of gallstones and prior open heart surgery. Some clinical concern for pulmonary embolism given positive D-dimer. EXAM: CT  ANGIOGRAPHY CHEST CT ABDOMEN AND PELVIS WITH CONTRAST TECHNIQUE: Multidetector CT imaging of the chest was performed using the standard protocol during bolus administration of intravenous contrast. Multiplanar CT image reconstructions and MIPs were obtained to evaluate the vascular anatomy. Multidetector CT imaging of the abdomen and pelvis was performed using the standard protocol during bolus administration of intravenous contrast. CONTRAST:  OMNIPAQUE IOHEXOL 350 MG/ML SOLN COMPARISON:  None. FINDINGS: CTA CHEST FINDINGS Cardiovascular: Satisfactory opacification of the pulmonary arteries to the segmental level. No evidence of pulmonary embolism. Normal heart size. No pericardial effusion. Surgical changes of prior multivessel CABG. Calcifications present along the native coronary arteries. Mediastinum/Nodes: Unremarkable CT appearance of the thyroid gland. No suspicious mediastinal or hilar adenopathy. No soft tissue  mediastinal mass. The thoracic esophagus is unremarkable. Lungs/Pleura: Mosaic attenuation throughout the lungs with areas of mild ground-glass attenuation airspace opacity interspersed with areas of relative lucency and possible hyperinflation. No focal airspace infiltrate, pulmonary edema, pleural effusion or pneumothorax. Musculoskeletal: No acute fracture or aggressive appearing lytic or blastic osseous lesion. Nonunion of median sternotomy defect. Cerclage wires remain intact. Review of the MIP images confirms the above findings. CT ABDOMEN and PELVIS FINDINGS Hepatobiliary: Normal hepatic contour and morphology. No discrete hepatic lesion or evidence of biliary ductal dilatation. Numerous calcified faceted stones present within the gallbladder lumen. No gallbladder distension, wall thickening or pericholecystic fluid. Pancreas: Unremarkable. No pancreatic ductal dilatation or surrounding inflammatory changes. Spleen: Normal in size without focal abnormality. Adrenals/Urinary Tract: Normal adrenal glands. Renal size discrepancy with the right kidney smaller than the left. No enhancing renal lesion. Small circumscribed renal cyst endophytic in the interpolar left kidney. Unremarkable ureters and bladder. Stomach/Bowel: Stomach is within normal limits. Appendix appears normal. No evidence of bowel wall thickening, distention, or inflammatory changes. Vascular/Lymphatic: Atherosclerotic calcifications throughout the abdominal aorta. No evidence of aneurysm or dissection. No suspicious lymphadenopathy. Reproductive: Uterus and bilateral adnexa are unremarkable. Other: No abdominal wall hernia or abnormality. No abdominopelvic ascites. Musculoskeletal: No acute or significant osseous findings. Review of the MIP images confirms the above findings. IMPRESSION: CTA CHEST 1. Negative for acute pulmonary embolus. 2. Mosaic attenuation throughout the lungs with areas of ground-glass attenuation airspace opacity  interspersed with areas of air trapping. Findings are nonspecific. Favor small airways disease. 3. Nonunion of median sternotomy defect. Cerclage wires remain intact. 4. Surgical changes of prior multivessel CABG. CT ABD/PELVIS 1. No acute abnormality within the abdomen or pelvis. 2. Cholelithiasis without evidence of acute cholecystitis. 3. Renal size discrepancy with the right kidney noticeably smaller than the left. 4.  Aortic Atherosclerosis (ICD10-I70.0). Electronically Signed   By: Malachy Moan M.D.   On: 11/26/2020 12:31   DG Chest Port 1 View  Result Date: 11/26/2020 CLINICAL DATA:  Chest pain. EXAM: PORTABLE CHEST 1 VIEW COMPARISON:  Two-view chest x-Courtnay Petrilla 10/22/2020 FINDINGS: Heart size is normal. Mild pulmonary vascular congestion is present without frank edema. No airspace disease is present. Visualized soft tissues and bony thorax are unremarkable. IMPRESSION: Mild pulmonary vascular congestion without frank edema. Electronically Signed   By: Marin Roberts M.D.   On: 11/26/2020 10:03    Procedures Procedures   Medications Ordered in ED Medications - No data to display  ED Course  I have reviewed the triage vital signs and the nursing notes.  Pertinent labs & imaging results that were available during my care of the patient were reviewed by me and considered in my medical decision making (see chart for details). Dr. Algie Coffer was reportedly  in the department and saw and evaluated the patient states that she can go home if her repeat troponin is negative. He also request that she receive a dose of Lasix   MDM Rules/Calculators/A&P HEAR Score: 6                        Chest pain, differential diagnosis Cardiac etiology-patient with known coronary artery disease but has had CABG.  EKG without acutely ischemic changes.  First troponin normal.  Will be cleared to be discharged from cardiology standpoint if second troponin is normal.  Patient had no significant change of her  chest pain with nitro.  And her chest pain now appears to be more musculoskeletal with increased pain with flexion and movement of her trunk. Acute intrathoracic etiologies such as PE ruled out with CTA.  Additionally no evidence of dissection. Patient with some groundglass appearance consistent with some volume overload Upper abdomen evaluated with labs and CT scan.  Patient has cholelithiasis without evidence of cholecystitis.  Her LFTs and lipase are normal. Patient given ibuprofen here for pain. Plan discharged with outpatient follow-up  Discussed return precautions and need for follow-up and patient voices understanding. Final Clinical Impression(s) / ED Diagnoses Final diagnoses:  Chest pain, unspecified type    Rx / DC Orders ED Discharge Orders    None       Margarita Grizzleay, Valerio Pinard, MD 11/26/20 1353

## 2020-11-28 ENCOUNTER — Other Ambulatory Visit: Payer: Self-pay | Admitting: Cardiovascular Disease

## 2020-11-28 DIAGNOSIS — R1011 Right upper quadrant pain: Secondary | ICD-10-CM

## 2020-12-21 ENCOUNTER — Other Ambulatory Visit: Payer: BC Managed Care – PPO

## 2021-05-09 ENCOUNTER — Other Ambulatory Visit: Payer: Self-pay | Admitting: Physician Assistant

## 2021-06-02 ENCOUNTER — Other Ambulatory Visit: Payer: Self-pay

## 2021-06-02 DIAGNOSIS — I6529 Occlusion and stenosis of unspecified carotid artery: Secondary | ICD-10-CM

## 2021-06-28 ENCOUNTER — Ambulatory Visit (HOSPITAL_COMMUNITY)
Admission: RE | Admit: 2021-06-28 | Discharge: 2021-06-28 | Disposition: A | Discharge status: Home / Self Care | Payer: BC Managed Care – PPO | Source: Ambulatory Visit | Attending: Vascular Surgery | Admitting: Vascular Surgery

## 2021-06-28 ENCOUNTER — Ambulatory Visit: Payer: BC Managed Care – PPO | Admitting: Vascular Surgery

## 2021-06-28 ENCOUNTER — Other Ambulatory Visit: Payer: Self-pay

## 2021-06-28 ENCOUNTER — Encounter: Payer: Self-pay | Admitting: Vascular Surgery

## 2021-06-28 VITALS — BP 120/59 | HR 58 | Temp 98.3°F | Resp 20 | Ht 63.0 in | Wt 178.0 lb

## 2021-06-28 DIAGNOSIS — I6529 Occlusion and stenosis of unspecified carotid artery: Secondary | ICD-10-CM | POA: Diagnosis present

## 2021-06-28 DIAGNOSIS — I6523 Occlusion and stenosis of bilateral carotid arteries: Secondary | ICD-10-CM

## 2021-06-28 NOTE — Progress Notes (Signed)
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  She understands we would not consider carotid endarterectomy unless the stenosis progressed to greater than 80% or she develop new neurologic symptoms.  I have ordered a follow-up carotid duplex scan in 1 year and I will see her at that time.  In addition she knows to remain on her aspirin.  She is also on Plavix.  Apparently in the past she did not tolerate statins.  Also encouraged her to stay as active as possible.  I will see her back in 1 year.  She knows to call sooner if she has problems.   HPI:   Cynthia Hale is a pleasant 55 y.o. female who I saw in consultation in the hospital a year ago on 06/13/2020 with an asymptomatic right carotid stenosis.  Carotid duplex scan this suggested an 80% right carotid stenosis.  The end-diastolic velocity was 122 cm/s suggesting this was right at 80%.  However when I looked at the images the stenosis did not appear as severe.  Subsequent CT scan suggested only a 60% stenosis.  The patient was being evaluated for coronary revascularization we have been consulted to consider concomitant right carotid endarterectomy.  I therefore favor proceeding with coronary artery bypass surgery and I can follow her carotid stenosis as an outpatient.  Since I saw her in the hospital she is doing well since her open heart surgery.  She is had no chest pain or shortness of breath.  She denies any focal weakness or paresthesias.  She denies any expressive aphasia or amaurosis fugax.  She does occasionally get some bright spots in her eyes.  In the past she had significant muscle aches from statins and I believe this is why she is not on a statin.  She is on aspirin and Plavix    Past Medical History:  Diagnosis Date   Diabetes mellitus without complication (HCC)    Hypertension     Vitamin D deficiency     History reviewed. No pertinent family history.  SOCIAL HISTORY: Social History   Tobacco Use   Smoking status: Never   Smokeless tobacco: Never  Substance Use Topics   Alcohol use: Yes    Comment: occ     Allergies  Allergen Reactions   Darvon [Propoxyphene] Nausea And Vomiting and Other (See Comments)    Darvocet- "years ago"- "Made me VERY sick."   Tape Rash and Other (See Comments)    "Tapes and Band-Aids blister my skin"    Current Outpatient Medications  Medication Sig Dispense Refill   acetaminophen (TYLENOL) 325 MG tablet Take 325-650 mg by mouth every 6 (six) hours as needed for mild pain (or headaches).     aspirin 81 MG chewable tablet Chew 81 mg by mouth daily.     clopidogrel (PLAVIX) 75 MG tablet Take 1 tablet (75 mg total) by mouth daily. 30 tablet 1   esomeprazole (NEXIUM) 40 MG capsule Take 40 mg by mouth daily.     lisinopril (ZESTRIL) 5 MG tablet Take 1 tablet (5 mg total) by mouth daily. 30 tablet 1   metFORMIN (GLUCOPHAGE) 500 MG tablet Take 500 mg by mouth 2 (two) times daily.     Metoprolol Succinate 25 MG CS24 Take by mouth.     nitroGLYCERIN (NITROSTAT) 0.4 MG SL tablet Place under the tongue.  Semaglutide (RYBELSUS) 7 MG TABS Take 7 mg by mouth daily.     SEMGLEE, YFGN, 100 UNIT/ML Pen SMARTSIG:10 Unit(s) SUB-Q Twice Daily     No current facility-administered medications for this visit.    REVIEW OF SYSTEMS:  [X]  denotes positive finding, [ ]  denotes negative finding Cardiac  Comments:  Chest pain or chest pressure:    Shortness of breath upon exertion: x   Short of breath when lying flat: x   Irregular heart rhythm: x       Vascular    Pain in calf, thigh, or hip brought on by ambulation: x   Pain in feet at night that wakes you up from your sleep:  x   Blood clot in your veins:    Leg swelling:  x       Pulmonary    Oxygen at home:    Productive cough:     Wheezing:         Neurologic    Sudden  weakness in arms or legs:     Sudden numbness in arms or legs:     Sudden onset of difficulty speaking or slurred speech:    Temporary loss of vision in one eye:     Problems with dizziness:         Gastrointestinal    Blood in stool:     Vomited blood:         Genitourinary    Burning when urinating:     Blood in urine:        Psychiatric    Major depression:         Hematologic    Bleeding problems:    Problems with blood clotting too easily:        Skin    Rashes or ulcers:        Constitutional    Fever or chills:     PHYSICAL EXAM:   Vitals:   06/28/21 1230  BP: (!) 153/72  Pulse: (!) 58  Resp: 20  Temp: 98.3 F (36.8 C)  SpO2: 97%  Weight: 178 lb (80.7 kg)  Height: 5\' 3"  (1.6 m)    GENERAL: The patient is a well-nourished female, in no acute distress. The vital signs are documented above. CARDIAC: There is a regular rate and rhythm.  VASCULAR: I do not detect carotid bruits. She has palpable posterior tibial pulses. PULMONARY: There is good air exchange bilaterally without wheezing or rales. ABDOMEN: Soft and non-tender with normal pitched bowel sounds.  MUSCULOSKELETAL: There are no major deformities or cyanosis. NEUROLOGIC: No focal weakness or paresthesias are detected. SKIN: There are no ulcers or rashes noted. PSYCHIATRIC: The patient has a normal affect.  DATA:    CAROTID DUPLEX: I have independently interpreted her carotid duplex scan today.  On the right side there is a 40 to 59% carotid stenosis at the higher end of that range.  The right vertebral artery is patent with antegrade flow.  On the left side there is a 40 to 59% stenosis.  Left vertebral artery is patent with antegrade flow.  [facility] [phone]

## 2021-07-30 NOTE — Unmapped External Note (Signed)
 OPERATIVE REPORT  Date of Surgery: 07/30/2021  Pre-op Diagnosis: Cholecystitis/cholelithiasis  Key Findings: See pathology.  No evidence of acute cholecystitis grossly.  Minimal chronic fibrotic changes surrounding the infundibulum.  Small anterior and posterior cystic arterial branches.  Perihepatic flimsy adhesions  Procedure: Laparoscopic cholecystectomy.  Takedown of perihepatic adhesions  Surgeon: Vanderbilt Pan, MD  Assistant: Ole Molt [facility]  Anesthesia: General Anesthesia  Estimated Blood Loss: Minimal  Specimens:  Specimens (From admission, onward)   None      Complications: None   Indication for procedure:  Patient with signs and symptoms consistent with cholecystitis. See recent note for details. Ultrasound revealed gallstones.  Liver profile revealed normal.  Options risks and benefits of cholecystectomy were discussed with the patient who elected to undergo the operation.   Procedure:    Preoperatively the patient received prophylactic IV antibiotics.  Sequential compression devices were applied.  The patient was then brought back to the operating room and properly positioned and monitored. General anesthesia was administered. The abdomen was then prepped and draped in a sterile fashion.  The Veress needle was introduced into the periumbilical area and the abdomen was insufflated to 15 mmHg pressure, using carbon dioxide. The Veress needle was withdrawn and an 11 mm bladed trocar was inserted. The 0, 10 mm camera was then inserted and the abdomen was inspected (see findings). Under direct vision a 5 mm bladed trocar was placed in the subxiphoid position and 2, 5 mm bladed trocars were placed in the right upper quadrant. The patient was then positioned reverse Trendelenburg, left lateral tilt.  Prior to addressing the gallbladder, flimsy right hepatic and left hepatic lobe flimsy adhesions were taken down without difficulty using the Endo scissors.  The gallbladder  was then retracted in a cephalad manner using 2, 5 mm graspers. The [place]  dissector was used to create a posterior window behind the cystic duct. The cystic duct junction with the gallbladder was clearly identified. The duct was milked towards the gallbladder junction. The cystic duct was doubly clipped proximally and singly clipped distally before being divided. The cystic artery was also identified going to the gallbladder.  2 small cystic arterial branches were noted, one anterior and one posterior.  Both structures were doubly clipped proximally, singly clipped distally and then divided.  The gallbladder was then peeled away from the liver bed using electrocautery. Once detached from the liver bed it was placed into an endoscopic basket and withdrawn from the periumbilical port site in a routine manner. The gallbladder was sent to pathology.  The infrahepatic space was then reinspected. The clips were intact and there was no evidence of bleeding or bile leakage. The supra and infrahepatic spaces were irrigated with 1 L of saline. The effluent was clear. The ports were then removed under direct vision. There was no bleeding from the port sites. The abdomen was desufflated.  The subcutaneous spaces were irrigated. The 11 mm fascial defect was closed with 0 Vicryl suture. The wounds were then infiltrated with 0.25% Marcaine  with Epinephrine , approximately 50 mL total.  The skin was closed with 4-0 Monocryl and Dermabond. The patient tolerated the procedure well and was taken to the recovery room in stable condition.   Electronically signed by: Vanderbilt Krystal Pan, MD 07/30/21 1013

## 2022-07-25 ENCOUNTER — Ambulatory Visit (HOSPITAL_COMMUNITY)
Admission: RE | Admit: 2022-07-25 | Discharge: 2022-07-25 | Disposition: A | Discharge status: Home / Self Care | Payer: BC Managed Care – PPO | Source: Ambulatory Visit | Attending: Cardiovascular Disease | Admitting: Cardiovascular Disease

## 2022-07-25 ENCOUNTER — Other Ambulatory Visit (HOSPITAL_COMMUNITY): Payer: Self-pay | Admitting: Cardiovascular Disease

## 2022-07-25 DIAGNOSIS — Z951 Presence of aortocoronary bypass graft: Secondary | ICD-10-CM | POA: Diagnosis not present

## 2022-07-25 DIAGNOSIS — R079 Chest pain, unspecified: Secondary | ICD-10-CM

## 2022-07-25 DIAGNOSIS — R072 Precordial pain: Secondary | ICD-10-CM | POA: Diagnosis not present

## 2022-07-25 DIAGNOSIS — I2510 Atherosclerotic heart disease of native coronary artery without angina pectoris: Secondary | ICD-10-CM | POA: Diagnosis not present

## 2022-07-25 LAB — ECHOCARDIOGRAM COMPLETE
Area-P 1/2: 3.68 cm2
S' Lateral: 2.4 cm

## 2022-07-25 MED ORDER — PERFLUTREN LIPID MICROSPHERE
1.0000 mL | INTRAVENOUS | Status: AC | PRN
  Administered 2022-07-25: 3 mL via INTRAVENOUS

## 2022-07-25 NOTE — Progress Notes (Signed)
  Echocardiogram 2D Echocardiogram has been performed.  Cynthia Hale 07/25/2022, 1:44 PM

## 2022-10-01 NOTE — Progress Notes (Signed)
 Subjective:    Cynthia Hale is a 57 y.o. (DOB 15-Jul-1966) female.     Patient presents with  . Toe Pain    Right big toe pain and bruising for a while. Sx are worsening. No known injury.     Patient presents with right great toe pain. She states she has had neuropathy for an extended period of time so she was contributing it to that but then over the past 2 weeks she has noticed color changes in the toenail and on the posterior side. She described it as bruising. She denied any known injury. She states the pain is intermittent and sharp but even at night when the sheets touch it, it bothers her. It does not hurt to touch the pad of her big toe or to move it. Her toenail is sore to the touch though. She claims she had an ingrown toenail on the lateral side of the great toe that she was able to remove with ease a couple of weeks ago. She denies temperature changes, swelling or significant erythema. The pain rarely radiates up her foot.  Review of Systems  Constitutional:  Negative for chills and fever.  Cardiovascular:  Negative for chest pain and palpitations.  Skin:  Positive for color change.      Objective:   Vitals:   10/01/22 0958  BP: 124/72  Pulse: 69  Temp: 98.2 F (36.8 C)  Resp: 18  Height: 5' 3 (1.6 m)  Weight: 161 lb (73 kg)  SpO2: 99%  BMI (Calculated): 28.5  PainSc:   3  PainLoc: Toe   Physical Exam Constitutional:      Appearance: Normal appearance.  HENT:     Head: Normocephalic and atraumatic.  Eyes:     Pupils: Pupils are equal, round, and reactive to light.  Cardiovascular:     Rate and Rhythm: Normal rate and regular rhythm.     Pulses: Normal pulses.          Dorsalis pedis pulses are 2+ on the right side and 2+ on the left side.       Posterior tibial pulses are 2+ on the right side and 2+ on the left side.     Comments: Dorsalis pedis pulses 2+ bilaterally.  Musculoskeletal:     Right foot: Bunion present.     Left foot: Bunion present.   Pulmonary:     Effort: Pulmonary effort is normal.     Breath sounds: Normal breath sounds.  Feet:     Right foot:     Skin integrity: Callus present. No ulcer.     Left foot:     Skin integrity: Callus present. No ulcer.     Comments: Right great toe had notable bruising on proximal end of nail. Skin color was comparable for both sides. Decreased sensation on plantar surface of  right great toe and right 1st and 3rd metatarsal.   Left great toe nail has been removed.  Skin:    General: Skin is warm and dry.  Neurological:     General: No focal deficit present.     Mental Status: She is alert.    Xray right foot: IMPRESSION:  1.  Soft tissue prominence medial to the 1st MTP joint possibly representing early soft tissue bunion, soft tissue edema or adventitial bursitis. 2.  Small calcification along the medial aspect of the 1st MTP joint capsule may be old posttraumatic/dystrophic in nature or less likely related to early crystalline arthropathy or calcific  periarthritis. 3.  Small erosion or intraosseous ganglion of the medial 1st metatarsal head.     Assessment / Plan:   Assessment 1. Great toe pain, right (Primary) -     Xr Foot Min 3 Views Right; Future; Expected date: 10/01/2022 2. Diabetic neuropathy with neurologic complication (*)    Plan Ordered x-ray to evaluate for foreign body vs fracture. Considered starting gabapentin pending X-ray results. Return to office or give us  a call if symptoms worsen or new symptoms arise. Refer to podiatry.  Continue to follow with diabetes management for neuropathy symptoms. Continue self foot exams at home for other color changes or ulcerations.  Risks, benefits, and alternatives of the medications and treatment plan prescribed today were discussed, and patient expressed understanding. Plan follow-up as discussed or as needed if any worsening symptoms or change in condition.        Documentation for time-based billing:  Total time  spent of date of service was 20 minutes.  Patient care activities included preparing to see the patient such as reviewing the patient record, obtaining and/or reviewing separately obtained history, performing a medically appropriate history and physical examination, counseling and educating the patient, family, and/or caregiver, ordering prescription medications, tests, or procedures, referring and communicating with other health care providers when not separately reported during the visit, documenting clinical information in the electronic or other health record, communicating results to the patient/family/caregiver, and coordinating the care of the patient when not separately reported.  I have personally obtained or reviewed the documented history of present illness and performed or reviewed the documented physical exam.  I have reviewed the student's documentation of the Past Medical, Family, and Social History [facility]), Review of Systems (ROS), exam and/or medical decision making, and based on my exam and medical decision making, confirm and agree.  Diagnostic tests and/or x-rays performed or reviewed: Xray as above.  Tinnie FORBES Salt, PA
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
## 2023-01-14 ENCOUNTER — Encounter (HOSPITAL_COMMUNITY): Payer: Self-pay

## 2023-01-15 ENCOUNTER — Observation Stay (HOSPITAL_COMMUNITY)
Admission: EM | Admit: 2023-01-15 | Discharge: 2023-01-17 | Disposition: A | Discharge status: Home / Self Care | Payer: BC Managed Care – PPO | Source: Ambulatory Visit | Attending: Cardiovascular Disease | Admitting: Cardiovascular Disease

## 2023-01-15 ENCOUNTER — Encounter (HOSPITAL_COMMUNITY): Payer: Self-pay | Admitting: Cardiovascular Disease

## 2023-01-15 DIAGNOSIS — I2089 Other forms of angina pectoris: Secondary | ICD-10-CM

## 2023-01-15 DIAGNOSIS — Z7982 Long term (current) use of aspirin: Secondary | ICD-10-CM | POA: Insufficient documentation

## 2023-01-15 DIAGNOSIS — I2510 Atherosclerotic heart disease of native coronary artery without angina pectoris: Secondary | ICD-10-CM | POA: Insufficient documentation

## 2023-01-15 DIAGNOSIS — I10 Essential (primary) hypertension: Secondary | ICD-10-CM | POA: Diagnosis not present

## 2023-01-15 DIAGNOSIS — Z951 Presence of aortocoronary bypass graft: Secondary | ICD-10-CM | POA: Insufficient documentation

## 2023-01-15 DIAGNOSIS — Z7902 Long term (current) use of antithrombotics/antiplatelets: Secondary | ICD-10-CM | POA: Diagnosis not present

## 2023-01-15 DIAGNOSIS — Z955 Presence of coronary angioplasty implant and graft: Secondary | ICD-10-CM | POA: Insufficient documentation

## 2023-01-15 DIAGNOSIS — I249 Acute ischemic heart disease, unspecified: Principal | ICD-10-CM | POA: Diagnosis present

## 2023-01-15 DIAGNOSIS — Z79899 Other long term (current) drug therapy: Secondary | ICD-10-CM | POA: Diagnosis not present

## 2023-01-15 DIAGNOSIS — Z7984 Long term (current) use of oral hypoglycemic drugs: Secondary | ICD-10-CM | POA: Insufficient documentation

## 2023-01-15 DIAGNOSIS — E119 Type 2 diabetes mellitus without complications: Secondary | ICD-10-CM | POA: Insufficient documentation

## 2023-01-15 LAB — CBC
HCT: 42.5 % (ref 36.0–46.0)
Hemoglobin: 14.2 g/dL (ref 12.0–15.0)
MCH: 28.6 pg (ref 26.0–34.0)
MCHC: 33.4 g/dL (ref 30.0–36.0)
MCV: 85.5 fL (ref 80.0–100.0)
Platelets: 303 10*3/uL (ref 150–400)
RBC: 4.97 MIL/uL (ref 3.87–5.11)
RDW: 13.2 % (ref 11.5–15.5)
WBC: 8.6 10*3/uL (ref 4.0–10.5)
nRBC: 0 % (ref 0.0–0.2)

## 2023-01-15 LAB — GLUCOSE, CAPILLARY
Glucose-Capillary: 82 mg/dL (ref 70–99)
Glucose-Capillary: 191 mg/dL — ABNORMAL HIGH (ref 70–99)

## 2023-01-15 LAB — COMPREHENSIVE METABOLIC PANEL
ALT: 27 U/L (ref 0–44)
AST: 20 U/L (ref 15–41)
Albumin: 3.9 g/dL (ref 3.5–5.0)
Alkaline Phosphatase: 58 U/L (ref 38–126)
Anion gap: 11 (ref 5–15)
BUN: 22 mg/dL — ABNORMAL HIGH (ref 6–20)
CO2: 26 mmol/L (ref 22–32)
Calcium: 9.8 mg/dL (ref 8.9–10.3)
Chloride: 102 mmol/L (ref 98–111)
Creatinine, Ser: 1.05 mg/dL — ABNORMAL HIGH (ref 0.44–1.00)
GFR, Estimated: >60 mL/min (ref >60)
Glucose, Bld: 87 mg/dL (ref 70–99)
Potassium: 4.1 mmol/L (ref 3.5–5.1)
Sodium: 139 mmol/L (ref 135–145)
Total Bilirubin: 0.8 mg/dL (ref 0.3–1.2)
Total Protein: 6.9 g/dL (ref 6.5–8.1)

## 2023-01-15 LAB — HEMOGLOBIN A1C
Hgb A1c MFr Bld: 6.2 % — ABNORMAL HIGH (ref 4.8–5.6)
Mean Plasma Glucose: 131 mg/dL

## 2023-01-15 LAB — HIV ANTIBODY (ROUTINE TESTING W REFLEX): HIV Screen 4th Generation wRfx: NONREACTIVE

## 2023-01-15 LAB — TROPONIN I (HIGH SENSITIVITY)
Troponin I (High Sensitivity): 8 ng/L (ref <18)
Troponin I (High Sensitivity): 9 ng/L (ref <18)

## 2023-01-15 LAB — HEPARIN LEVEL (UNFRACTIONATED): Heparin Unfractionated: 0.4 IU/mL (ref 0.30–0.70)

## 2023-01-15 MED ORDER — ATORVASTATIN CALCIUM 40 MG PO TABS
40.0000 mg | ORAL_TABLET | Freq: Every day | ORAL | Status: DC
  Administered 2023-01-16 – 2023-01-17 (×2): 40 mg via ORAL
  Filled 2023-01-15 (×2): qty 1

## 2023-01-15 MED ORDER — ONDANSETRON HCL 4 MG/2ML IJ SOLN: 4.0000 mg | Freq: Four times a day (QID) | INTRAMUSCULAR | Status: DC | PRN

## 2023-01-15 MED ORDER — ASPIRIN 81 MG PO TBEC
81.0000 mg | DELAYED_RELEASE_TABLET | Freq: Every day | ORAL | Status: DC
  Administered 2023-01-16 – 2023-01-17 (×2): 81 mg via ORAL
  Filled 2023-01-15 (×2): qty 1

## 2023-01-15 MED ORDER — SODIUM CHLORIDE 0.9 % IV SOLN: 250.0000 mL | INTRAVENOUS | Status: DC | PRN

## 2023-01-15 MED ORDER — NITROGLYCERIN 0.4 MG SL SUBL: 0.4000 mg | SUBLINGUAL_TABLET | SUBLINGUAL | Status: DC | PRN

## 2023-01-15 MED ORDER — HEPARIN BOLUS VIA INFUSION
4000.0000 [IU] | Freq: Once | INTRAVENOUS | Status: AC
  Administered 2023-01-15: 4000 [IU] via INTRAVENOUS
  Filled 2023-01-15: qty 4000

## 2023-01-15 MED ORDER — METOPROLOL SUCCINATE ER 25 MG PO TB24
25.0000 mg | ORAL_TABLET | Freq: Every day | ORAL | Status: DC
  Administered 2023-01-16 – 2023-01-17 (×2): 25 mg via ORAL
  Filled 2023-01-15 (×2): qty 1

## 2023-01-15 MED ORDER — INSULIN ASPART 100 UNIT/ML IJ SOLN: 0.0000 [IU] | Freq: Every day | INTRAMUSCULAR | Status: DC

## 2023-01-15 MED ORDER — CLOPIDOGREL BISULFATE 75 MG PO TABS
75.0000 mg | ORAL_TABLET | Freq: Every day | ORAL | Status: DC
  Administered 2023-01-16 – 2023-01-17 (×2): 75 mg via ORAL
  Filled 2023-01-15 (×2): qty 1

## 2023-01-15 MED ORDER — PANTOPRAZOLE SODIUM 40 MG PO TBEC
40.0000 mg | DELAYED_RELEASE_TABLET | Freq: Every day | ORAL | Status: DC
  Administered 2023-01-16 – 2023-01-17 (×2): 40 mg via ORAL
  Filled 2023-01-15 (×2): qty 1

## 2023-01-15 MED ORDER — SODIUM CHLORIDE 0.9 % IV SOLN: INTRAVENOUS | Status: DC

## 2023-01-15 MED ORDER — INSULIN ASPART 100 UNIT/ML IJ SOLN
0.0000 [IU] | Freq: Three times a day (TID) | INTRAMUSCULAR | Status: DC
  Administered 2023-01-16 – 2023-01-17 (×2): 2 [IU] via SUBCUTANEOUS

## 2023-01-15 MED ORDER — SODIUM CHLORIDE 0.9% FLUSH: 3.0000 mL | INTRAVENOUS | Status: DC | PRN

## 2023-01-15 MED ORDER — ACETAMINOPHEN 325 MG PO TABS: 650.0000 mg | ORAL_TABLET | ORAL | Status: DC | PRN

## 2023-01-15 MED ORDER — SODIUM CHLORIDE 0.9% FLUSH: 3.0000 mL | Freq: Two times a day (BID) | INTRAVENOUS | Status: DC

## 2023-01-15 MED ORDER — HEPARIN (PORCINE) 25000 UT/250ML-% IV SOLN
800.0000 [IU]/h | INTRAVENOUS | Status: DC
  Administered 2023-01-15: 800 [IU]/h via INTRAVENOUS
  Filled 2023-01-15: qty 250

## 2023-01-15 MED ORDER — ASPIRIN 300 MG RE SUPP
300.0000 mg | RECTAL | Status: DC
  Filled 2023-01-15: qty 1

## 2023-01-15 MED ORDER — ASPIRIN 81 MG PO CHEW: 324.0000 mg | CHEWABLE_TABLET | ORAL | Status: DC

## 2023-01-15 NOTE — H&P (Signed)
Referring Physician:  Alaura Schippers is an 57 y.o. female.                       Chief Complaint: Chest pain  HPI: 57 years old female with known h/o CAD, s/p LCx stent and CABG, HTN and type 2 DM has substernal chest pain with and without exertion radiating to neck and jaw area worsening in frequency over 3 months.  Past Medical History:  Diagnosis Date   Diabetes mellitus without complication (HCC)    Hypertension    Vitamin D deficiency       Past Surgical History:  Procedure Laterality Date   CORONARY ARTERY BYPASS GRAFT N/A 06/14/2020   Procedure: CORONARY ARTERY BYPASS GRAFTING (CABG) TIMES FOUR USING LIMA TO LAD;RIMA TO OM1; RADIAL ARTERY SEQUENCED DIAG TO OM2;  Surgeon: Linden Dolin, MD;  Location: [facility] OR;  Service: Open Heart Surgery;  Laterality: N/A;  Bilateral IMA   LEFT HEART CATH AND CORONARY ANGIOGRAPHY N/A 06/13/2020   Procedure: LEFT HEART CATH AND CORONARY ANGIOGRAPHY;  Surgeon: [name], MD;  Location: [facility] INVASIVE CV LAB;  Service: Cardiovascular;  Laterality: N/A;   RADIAL ARTERY HARVEST Right 06/14/2020   Procedure: RADIAL ARTERY HARVEST;  Surgeon: Linden Dolin, MD;  Location: [facility] OR;  Service: Open Heart Surgery;  Laterality: Right;   TEE WITHOUT CARDIOVERSION N/A 06/14/2020   Procedure: TRANSESOPHAGEAL ECHOCARDIOGRAM (TEE);  Surgeon: Linden Dolin, MD;  Location: [facility] OR;  Service: Open Heart Surgery;  Laterality: N/A;    No family history on file. Social History:  reports that she has never smoked. She has never used smokeless tobacco. She reports current alcohol use. She reports that she does not use drugs.  Allergies:  Allergies  Allergen Reactions   Darvon [Propoxyphene] Nausea And Vomiting and Other (See Comments)    Darvocet- "years ago"- "Made me VERY sick."   Tape Rash and Other (See Comments)    "Tapes and Band-Aids blister my skin"    Medications Prior to Admission  Medication Sig Dispense Refill   acetaminophen (TYLENOL) 325 MG  tablet Take 325-650 mg by mouth every 6 (six) hours as needed for mild pain (or headaches).     aspirin 81 MG chewable tablet Chew 81 mg by mouth daily.     clopidogrel (PLAVIX) 75 MG tablet Take 1 tablet (75 mg total) by mouth daily. 30 tablet 1   esomeprazole (NEXIUM) 40 MG capsule Take 40 mg by mouth daily.     lisinopril (ZESTRIL) 5 MG tablet Take 1 tablet (5 mg total) by mouth daily. 30 tablet 1   metFORMIN (GLUCOPHAGE) 500 MG tablet Take 500 mg by mouth 2 (two) times daily.     Metoprolol Succinate 25 MG CS24 Take by mouth.     nitroGLYCERIN (NITROSTAT) 0.4 MG SL tablet Place under the tongue.     Semaglutide (RYBELSUS) 7 MG TABS Take 7 mg by mouth daily.     SEMGLEE, YFGN, 100 UNIT/ML Pen SMARTSIG:10 Unit(s) SUB-Q Twice Daily      No results found for this or any previous visit (from the past 48 hour(s)). No results found.  Review Of Systems Constitutional: No fever, chills, weight loss or gain. Eyes: No vision change, wears glasses. No discharge or pain. Ears: No hearing loss, No tinnitus. Respiratory: No asthma, COPD, pneumonias. Positive shortness of breath. No hemoptysis. Cardiovascular: Positive chest pain, no palpitation, leg edema. Gastrointestinal: No nausea, vomiting, diarrhea, constipation. No GI bleed. No hepatitis.  Genitourinary: No dysuria, hematuria, kidney stone. No incontinance. Neurological: No headache, stroke, seizures.  Psychiatry: No psych facility admission for anxiety, depression, suicide. No detox. Skin: No rash. Musculoskeletal: Positive joint pain, fibromyalgia. No neck pain, back pain. Lymphadenopathy: No lymphadenopathy. Hematology: No anemia or easy bruising.   Blood pressure (!) 145/74, pulse 70, temperature 98 F (36.7 C), temperature source Oral, height 5\' 3"  (1.6 m), weight 73.2 kg, SpO2 98 %. Body mass index is 28.57 kg/m. General appearance: alert, cooperative, appears stated age and no distress Head: Normocephalic, atraumatic. Eyes: Blue  eyes, pink conjunctiva, corneas clear.  Neck: No adenopathy, positive carotid bruit, no JVD, supple, symmetrical, trachea midline and thyroid not enlarged. Resp: Clear to auscultation bilaterally. Cardio: Regular rate and rhythm, S1, S2 normal, II/VI systolic murmur, no click, rub or gallop GI: Soft, non-tender; bowel sounds normal; no organomegaly. Extremities: No edema, cyanosis or clubbing. Skin: Warm and dry.  Neurologic: Alert and oriented X 3, normal strength. Normal coordination and gait.  Assessment/Plan Acute coronary syndrome HTN HLD Type 2 DM S/P CABG S/P LCx stent (08/2018)  Plan: Place in observation Check troponin I levels. IV heparin. Cardiac cath in AM.  Time spent: Review of old records, Lab, x-rays, EKG, other cardiac tests, examination, discussion with patient/Nurse over 70 minutes.  Ricki Rodriguez, MD  01/15/2023, 2:59 PM

## 2023-01-15 NOTE — Progress Notes (Signed)
ANTICOAGULATION CONSULT NOTE - Follow Up Consult  Pharmacy Consult for heparin Indication: chest pain/ACS  Allergies  Allergen Reactions   Darvon [Propoxyphene] Nausea And Vomiting and Other (See Comments)    Darvocet- "years ago"- "Made me VERY sick."   Tape Rash and Other (See Comments)    "Tapes and Band-Aids blister my skin"    Patient Measurements: Height: 5\' 3"  (160 cm) Weight: 73.2 kg (161 lb 4.8 oz) IBW/kg (Calculated) : 52.4 Heparin Dosing Weight: 67.8 kg   Vital Signs: Temp: 98.2 F (36.8 C) (06/26 1952) Temp Source: Oral (06/26 1952) BP: 93/57 (06/26 1952) Pulse Rate: 71 (06/26 1952)  Labs: Recent Labs    01/15/23 1508 01/15/23 1534 01/15/23 1736 01/15/23 2212  HGB 14.2  --   --   --   HCT 42.5  --   --   --   PLT 303  --   --   --   HEPARINUNFRC  --   --   --  0.40  CREATININE  --  1.05*  --   --   TROPONINIHS  --  8 9  --     Estimated Creatinine Clearance: 55.3 mL/min (A) (by C-G formula based on SCr of 1.05 mg/dL (H)).   Medical History: Past Medical History:  Diagnosis Date   Diabetes mellitus without complication (HCC)    Hypertension    Vitamin D deficiency     Medications:  Scheduled:   [START ON 01/16/2023] aspirin EC  81 mg Oral Daily   atorvastatin  40 mg Oral Daily   [START ON 01/16/2023] clopidogrel  75 mg Oral Q breakfast   insulin aspart  0-15 Units Subcutaneous TID WC   insulin aspart  0-5 Units Subcutaneous QHS   [START ON 01/16/2023] metoprolol succinate  25 mg Oral Q breakfast   [START ON 01/16/2023] pantoprazole  40 mg Oral Daily   sodium chloride flush  3 mL Intravenous Q12H    Assessment: Cynthia Hale presenting with chest pain. Started on heparin infusion for ACS - no AC PTA.  No s/sx of bleeding noted. Heparin level therapeutic at 0.40. HgB 14.2 and PLTs: 303.   Goal of Therapy:  Heparin level 0.3-0.7 units/ml Monitor platelets by anticoagulation protocol: Yes   Plan:  Continue heparin infusion at 800 units/hr F/u  cardiology plans, appears current plan is just for observation.  Re-check anti-Xa level with AM labs and daily while on heparin Continue to monitor H&H and platelets  Thank you for allowing pharmacy to participate in this patient's care,  Estill Batten, PharmD, BCCCP  Clinical Pharmacist  Phone: [phone] 01/15/2023 11:41 PM  Please check AMION for all [facility] phone numbers After 10:00 PM, call Main Pharmacy [phone]

## 2023-01-15 NOTE — Progress Notes (Signed)
ANTICOAGULATION CONSULT NOTE - Initial Consult  Pharmacy Consult for heparin Indication: chest pain/ACS  Allergies  Allergen Reactions   Darvon [Propoxyphene] Nausea And Vomiting and Other (See Comments)    Darvocet- "years ago"- "Made me VERY sick."   Tape Rash and Other (See Comments)    "Tapes and Band-Aids blister my skin"    Patient Measurements: Height: 5\' 3"  (160 cm) Weight: 73.2 kg (161 lb 4.8 oz) IBW/kg (Calculated) : 52.4 Heparin Dosing Weight: 67.8 kg   Vital Signs: Temp: 98 F (36.7 C) (06/26 1300) Temp Source: Oral (06/26 1300)  Labs: No results for input(s): "HGB", "HCT", "PLT", "APTT", "LABPROT", "INR", "HEPARINUNFRC", "HEPRLOWMOCWT", "CREATININE", "CKTOTAL", "CKMB", "TROPONINIHS" in the last 72 hours.  CrCl cannot be calculated (Patient's most recent lab result is older than the maximum 21 days allowed.).   Medical History: Past Medical History:  Diagnosis Date   Diabetes mellitus without complication (HCC)    Hypertension    Vitamin D deficiency     Medications:  Scheduled:   aspirin  324 mg Oral NOW   Or   aspirin  300 mg Rectal NOW   [START ON 01/16/2023] aspirin EC  81 mg Oral Daily   [START ON 01/16/2023] clopidogrel  75 mg Oral Q breakfast    Assessment: Cynthia Hale presenting with chest pain. Started on heparin infusion for ACS - no AC PTA.  No s/sx of bleeding.   Goal of Therapy:  Heparin level 0.3-0.7 units/ml Monitor platelets by anticoagulation protocol: Yes   Plan:  Give 4000 units bolus x 1 Start heparin infusion at 800 units/hr Check anti-Xa level in 6 hours and daily while on heparin Continue to monitor H&H and platelets  Thank you for allowing pharmacy to participate in this patient's care,  Sherron Monday, PharmD, BCCCP Clinical Pharmacist  Phone: [phone] 01/15/2023 2:37 PM  Please check AMION for all [facility] phone numbers After 10:00 PM, call Main Pharmacy [phone]

## 2023-01-16 ENCOUNTER — Encounter (HOSPITAL_COMMUNITY)
Admission: EM | Disposition: A | Discharge status: Home / Self Care | Payer: Self-pay | Source: Ambulatory Visit | Attending: Cardiovascular Disease

## 2023-01-16 DIAGNOSIS — I10 Essential (primary) hypertension: Secondary | ICD-10-CM | POA: Diagnosis not present

## 2023-01-16 DIAGNOSIS — I249 Acute ischemic heart disease, unspecified: Secondary | ICD-10-CM | POA: Diagnosis not present

## 2023-01-16 DIAGNOSIS — E119 Type 2 diabetes mellitus without complications: Secondary | ICD-10-CM | POA: Diagnosis not present

## 2023-01-16 DIAGNOSIS — I2510 Atherosclerotic heart disease of native coronary artery without angina pectoris: Secondary | ICD-10-CM | POA: Diagnosis not present

## 2023-01-16 HISTORY — PX: LEFT HEART CATH AND CORS/GRAFTS ANGIOGRAPHY: CATH118250

## 2023-01-16 LAB — CBC WITH DIFFERENTIAL/PLATELET
Abs Immature Granulocytes: 0.01 10*3/uL (ref 0.00–0.07)
Basophils Absolute: 0.1 10*3/uL (ref 0.0–0.1)
Basophils Relative: 1 %
Eosinophils Absolute: 0.1 10*3/uL (ref 0.0–0.5)
Eosinophils Relative: 1 %
HCT: 38.6 % (ref 36.0–46.0)
Hemoglobin: 12.8 g/dL (ref 12.0–15.0)
Immature Granulocytes: 0 %
Lymphocytes Relative: 53 %
Lymphs Abs: 4.8 10*3/uL — ABNORMAL HIGH (ref 0.7–4.0)
MCH: 28.5 pg (ref 26.0–34.0)
MCHC: 33.2 g/dL (ref 30.0–36.0)
MCV: 86 fL (ref 80.0–100.0)
Monocytes Absolute: 0.6 10*3/uL (ref 0.1–1.0)
Monocytes Relative: 6 %
Neutro Abs: 3.6 10*3/uL (ref 1.7–7.7)
Neutrophils Relative %: 39 %
Platelets: 285 10*3/uL (ref 150–400)
RBC: 4.49 MIL/uL (ref 3.87–5.11)
RDW: 13.4 % (ref 11.5–15.5)
WBC: 9.2 10*3/uL (ref 4.0–10.5)
nRBC: 0 % (ref 0.0–0.2)

## 2023-01-16 LAB — GLUCOSE, CAPILLARY
Glucose-Capillary: 122 mg/dL — ABNORMAL HIGH (ref 70–99)
Glucose-Capillary: 92 mg/dL (ref 70–99)
Glucose-Capillary: 117 mg/dL — ABNORMAL HIGH (ref 70–99)
Glucose-Capillary: 125 mg/dL — ABNORMAL HIGH (ref 70–99)

## 2023-01-16 LAB — LIPID PANEL
Cholesterol: 131 mg/dL (ref 0–200)
HDL: 43 mg/dL (ref >40)
LDL Cholesterol: 44 mg/dL (ref 0–99)
Total CHOL/HDL Ratio: 3 RATIO
Triglycerides: 221 mg/dL — ABNORMAL HIGH (ref <150)
VLDL: 44 mg/dL — ABNORMAL HIGH (ref 0–40)

## 2023-01-16 LAB — POCT ACTIVATED CLOTTING TIME: Activated Clotting Time: 146 seconds

## 2023-01-16 LAB — HEPARIN LEVEL (UNFRACTIONATED): Heparin Unfractionated: 0.49 IU/mL (ref 0.30–0.70)

## 2023-01-16 SURGERY — LEFT HEART CATH AND CORS/GRAFTS ANGIOGRAPHY
Anesthesia: LOCAL

## 2023-01-16 MED ORDER — SODIUM CHLORIDE 0.9 % IV SOLN: 250.0000 mL | INTRAVENOUS | Status: DC | PRN

## 2023-01-16 MED ORDER — SODIUM CHLORIDE 0.9% FLUSH
3.0000 mL | Freq: Two times a day (BID) | INTRAVENOUS | Status: DC
  Administered 2023-01-17: 3 mL via INTRAVENOUS

## 2023-01-16 MED ORDER — SODIUM CHLORIDE 0.9 % IV SOLN: INTRAVENOUS | Status: DC

## 2023-01-16 MED ORDER — IOHEXOL 350 MG/ML SOLN
INTRAVENOUS | Status: DC | PRN
  Administered 2023-01-16: 110 mL

## 2023-01-16 MED ORDER — SODIUM CHLORIDE 0.9% FLUSH: 3.0000 mL | INTRAVENOUS | Status: DC | PRN

## 2023-01-16 MED ORDER — FENTANYL CITRATE (PF) 100 MCG/2ML IJ SOLN
INTRAMUSCULAR | Status: AC
  Filled 2023-01-16: qty 2

## 2023-01-16 MED ORDER — LIDOCAINE HCL (PF) 1 % IJ SOLN
INTRAMUSCULAR | Status: AC
  Filled 2023-01-16: qty 30

## 2023-01-16 MED ORDER — RANOLAZINE ER 500 MG PO TB12
500.0000 mg | ORAL_TABLET | Freq: Two times a day (BID) | ORAL | Status: DC
  Administered 2023-01-16 – 2023-01-17 (×2): 500 mg via ORAL
  Filled 2023-01-16 (×2): qty 1

## 2023-01-16 MED ORDER — MIDAZOLAM HCL 2 MG/2ML IJ SOLN
INTRAMUSCULAR | Status: AC
  Filled 2023-01-16: qty 2

## 2023-01-16 MED ORDER — LABETALOL HCL 5 MG/ML IV SOLN: 10.0000 mg | INTRAVENOUS | Status: DC | PRN

## 2023-01-16 MED ORDER — MIDAZOLAM HCL 2 MG/2ML IJ SOLN
INTRAMUSCULAR | Status: DC | PRN
  Administered 2023-01-16: 1 mg via INTRAVENOUS

## 2023-01-16 MED ORDER — HEPARIN (PORCINE) IN NACL 1000-0.9 UT/500ML-% IV SOLN
INTRAVENOUS | Status: DC | PRN
  Administered 2023-01-16 (×2): 500 mL

## 2023-01-16 MED ORDER — HYDRALAZINE HCL 20 MG/ML IJ SOLN: 10.0000 mg | INTRAMUSCULAR | Status: DC | PRN

## 2023-01-16 MED ORDER — HEPARIN SODIUM (PORCINE) 5000 UNIT/ML IJ SOLN
5000.0000 [IU] | Freq: Three times a day (TID) | INTRAMUSCULAR | Status: DC
  Administered 2023-01-16 – 2023-01-17 (×2): 5000 [IU] via SUBCUTANEOUS
  Filled 2023-01-16 (×2): qty 1

## 2023-01-16 MED ORDER — FENTANYL CITRATE (PF) 100 MCG/2ML IJ SOLN
INTRAMUSCULAR | Status: DC | PRN
  Administered 2023-01-16: 25 ug via INTRAVENOUS

## 2023-01-16 MED ORDER — LIDOCAINE HCL (PF) 1 % IJ SOLN
INTRAMUSCULAR | Status: DC | PRN
  Administered 2023-01-16: 5 mL via INTRADERMAL

## 2023-01-16 SURGICAL SUPPLY — 10 items
CATH INFINITI 5 FR 3DRC (CATHETERS) IMPLANT
CATH INFINITI 5FR JL4 (CATHETERS) IMPLANT
CATH INFINITI 5FR MULTPACK ANG (CATHETERS) IMPLANT
KIT HEART LEFT (KITS) ×1 IMPLANT
PACK CARDIAC CATHETERIZATION (CUSTOM PROCEDURE TRAY) ×1 IMPLANT
SHEATH PINNACLE 5F 10CM (SHEATH) IMPLANT
SHEATH PROBE COVER 6X72 (BAG) IMPLANT
SYR MEDRAD MARK 7 150ML (SYRINGE) ×1 IMPLANT
TRANSDUCER W/STOPCOCK (MISCELLANEOUS) ×1 IMPLANT
WIRE EMERALD 3MM-J .035X150CM (WIRE) IMPLANT

## 2023-01-16 NOTE — Progress Notes (Signed)
Ref: Heron Nay, PA   Subjective:  Awake. VS stable. Aware of all the bypass grafts are working but has native vessel disease and diffuse radial artery graft disease.  Objective:  Vital Signs in the last 24 hours: Temp:  [97.8 F (36.6 C)-98.6 F (37 C)] 97.8 F (36.6 C) (06/27 1025) Pulse Rate:  [61-71] 61 (06/27 1025) Cardiac Rhythm: Normal sinus rhythm;Bundle branch block (06/27 0700) Resp:  [16-18] 16 (06/27 1120) BP: (93-147)/(45-74) 120/74 (06/27 1120) SpO2:  [95 %-100 %] 98 % (06/27 1120) Weight:  [73.5 kg] 73.5 kg (06/27 0517)  Physical Exam: BP Readings from Last 1 Encounters:  01/16/23 120/74     Wt Readings from Last 1 Encounters:  01/16/23 73.5 kg    Weight change:  Body mass index is 28.72 kg/m. HEENT: [place]/AT, Eyes-Blue, Conjunctiva-Pink, Sclera-Non-icteric Neck: No JVD, No bruit, Trachea midline. Lungs:  Clear, Bilateral. Cardiac:  Regular rhythm, normal S1 and S2, no S3. II/VI systolic murmur. Abdomen:  Soft, non-tender. BS present. Extremities:  No edema present. No cyanosis. No clubbing. Right groin cath site is stable CNS: AxOx3, Cranial nerves grossly intact, moves all 4 extremities.  Skin: Warm and dry.   Intake/Output from previous day: 06/26 0701 - 06/27 0700 In: 1201 [P.O.:480; I.V.:721] Out: 400 [Urine:400]    Lab Results: BMET    Component Value Date/Time   NA 139 01/15/2023 1534   NA 138 11/26/2020 0915   NA 137 06/19/2020 0641   K 4.1 01/15/2023 1534   K 4.1 11/26/2020 0915   K 3.9 06/19/2020 0641   CL 102 01/15/2023 1534   CL 103 11/26/2020 0915   CL 96 (L) 06/19/2020 0641   CO2 26 01/15/2023 1534   CO2 24 11/26/2020 0915   CO2 28 06/19/2020 0641   GLUCOSE 87 01/15/2023 1534   GLUCOSE 178 (H) 11/26/2020 0915   GLUCOSE 170 (H) 06/19/2020 0641   BUN 22 (H) 01/15/2023 1534   BUN 19 11/26/2020 0915   BUN 13 06/19/2020 0641   CREATININE 1.05 (H) 01/15/2023 1534   CREATININE 0.85 11/26/2020 0915   CREATININE 1.02 (H)  06/19/2020 0641   CALCIUM 9.8 01/15/2023 1534   CALCIUM 9.7 11/26/2020 0915   CALCIUM 9.3 06/19/2020 0641   GFRNONAA >60 01/15/2023 1534   GFRNONAA >60 11/26/2020 0915   GFRNONAA >60 06/19/2020 0641   GFRAA >60 06/08/2017 1649   CBC    Component Value Date/Time   WBC 9.2 01/16/2023 0043   RBC 4.49 01/16/2023 0043   HGB 12.8 01/16/2023 0043   HCT 38.6 01/16/2023 0043   PLT 285 01/16/2023 0043   MCV 86.0 01/16/2023 0043   MCH 28.5 01/16/2023 0043   MCHC 33.2 01/16/2023 0043   RDW 13.4 01/16/2023 0043   LYMPHSABS 4.8 (H) 01/16/2023 0043   MONOABS 0.6 01/16/2023 0043   EOSABS 0.1 01/16/2023 0043   BASOSABS 0.1 01/16/2023 0043   HEPATIC Function Panel Recent Labs    01/15/23 1534  PROT 6.9  ALBUMIN 3.9  AST 20  ALT 27  ALKPHOS 58   HEMOGLOBIN A1C Lab Results  Component Value Date   MPG 131 01/15/2023   CARDIAC ENZYMES No results found for: "CKTOTAL", "CKMB", "CKMBINDEX", "TROPONINI" BNP No results for input(s): "PROBNP" in the last 8760 hours. TSH No results for input(s): "TSH" in the last 8760 hours. CHOLESTEROL Recent Labs    01/16/23 0043  CHOL 131    Scheduled Meds:  aspirin EC  81 mg Oral Daily   atorvastatin  40 mg Oral Daily   clopidogrel  75 mg Oral Q breakfast   insulin aspart  0-15 Units Subcutaneous TID WC   insulin aspart  0-5 Units Subcutaneous QHS   metoprolol succinate  25 mg Oral Q breakfast   pantoprazole  40 mg Oral Daily   ranolazine  500 mg Oral BID   sodium chloride flush  3 mL Intravenous Q12H   Continuous Infusions:  sodium chloride     PRN Meds:.acetaminophen, nitroGLYCERIN, ondansetron (ZOFRAN) IV  Assessment/Plan: CAD CABG HTN Type 2 DM Stable angina  Start Ranolazine. Start small dose Imdur. Start cardiac rehab.  Home in AM post blood work. IV fluids low dose.   LOS: 0 days   Time spent including chart review, lab review, examination, discussion with patient/Nurse/Social worker : 30 min   [name]  MD   01/16/2023, 4:14 PM

## 2023-01-16 NOTE — TOC Initial Note (Signed)
Transition of Care [facility]) - Initial/Assessment Note    Patient Details  Name: Cynthia Hale MRN: [id number] Date of Birth: [birth year]  Transition of Care [facility]) CM/SW Contact:    Leone Haven, RN Phone Number: 01/16/2023, 10:36 AM  Clinical Narrative:                 From home with sign other, she is indep.  She has PCP, Dr. Maple Hudson, and insurance on file, she currently has no HH services in place or any DME at home.  She states her Friend , Amada Jupiter will transport her home at dc,she gets her medications from [facility] and also [facility] on [address] in [place]. She states she would like to do cardiac rehab, NCM informed MD of this information.  Expected Discharge Plan: Home/Self Care Barriers to Discharge: Continued Medical Work up   Patient Goals and CMS Choice Patient states their goals for this hospitalization and ongoing recovery are:: return home with friend   Choice offered to / list presented to : NA      Expected Discharge Plan and Services In-house Referral: NA Discharge Planning Services: CM Consult Post Acute Care Choice: NA Living arrangements for the past 2 months: Single Family Home                 DME Arranged: N/A DME Agency: NA       HH Arranged: NA          Prior Living Arrangements/Services Living arrangements for the past 2 months: Single Family Home Lives with:: Significant Other Patient language and need for interpreter reviewed:: Yes Do you feel safe going back to the place where you live?: Yes      Need for Family Participation in Patient Care: Yes (Comment) Care giver support system in place?: Yes (comment)   Criminal Activity/Legal Involvement Pertinent to Current Situation/Hospitalization: No - Comment as needed  Activities of Daily Living Home Assistive Devices/Equipment: None ADL Screening (condition at time of admission) Patient's cognitive ability adequate to safely complete daily activities?: Yes Is the patient deaf or  have difficulty hearing?: No Does the patient have difficulty seeing, even when wearing glasses/contacts?: No Does the patient have difficulty concentrating, remembering, or making decisions?: No Patient able to express need for assistance with ADLs?: No Does the patient have difficulty dressing or bathing?: No Independently performs ADLs?: Yes (appropriate for developmental age) Does the patient have difficulty walking or climbing stairs?: No Weakness of Legs: None Weakness of Arms/Hands: None  Permission Sought/Granted                  Emotional Assessment Appearance:: Appears stated age Attitude/Demeanor/Rapport: Engaged Affect (typically observed): Appropriate Orientation: : Oriented to Self, Oriented to Place, Oriented to  Time, Oriented to Situation Alcohol / Substance Use: Not Applicable Psych Involvement: No (comment)  Admission diagnosis:  Acute coronary syndrome [facility]) [I24.9] Patient Active Problem List   Diagnosis Date Noted   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 [place], [place] - [address] [place] [address] Phone: [phone] Fax: [phone]  Social Determinants of Health (SDOH) Social History: SDOH Screenings   Food Insecurity: No Food Insecurity (01/15/2023)  Housing: Low Risk  (01/15/2023)  Transportation Needs: No Transportation Needs (01/15/2023)  Utilities: Not At Risk (01/15/2023)  Tobacco Use: Low Risk  (01/15/2023)   SDOH Interventions:     Readmission Risk Interventions    06/20/2020   12:49 PM  Readmission Risk Prevention Plan  Post Dischage Appt  Complete  Medication Screening Complete  Transportation Screening Complete

## 2023-01-16 NOTE — Plan of Care (Signed)

## 2023-01-16 NOTE — Interval H&P Note (Signed)
History and Physical Interval Note:  01/16/2023 8:29 AM  Cynthia Hale  has presented today for surgery, with the diagnosis of acs.  The various methods of treatment have been discussed with the patient and family. After consideration of risks, benefits and other options for treatment, the patient has consented to  Procedure(s): LEFT HEART CATH AND CORS/GRAFTS ANGIOGRAPHY (N/A) as a surgical intervention.  The patient's history has been reviewed, patient examined, no change in status, stable for surgery.  I have reviewed the patient's chart and labs.  Questions were answered to the patient's satisfaction.     Ricki Rodriguez

## 2023-01-16 NOTE — Progress Notes (Signed)
Patient left unit for Cath Lab at this time.

## 2023-01-16 NOTE — Progress Notes (Signed)
Patient returned form Cathlab, VSS, site cdi, noted with hematoma, cathlab staffs at bedside applying pressure, site marked. Patient denied any distress. Will cont to monitor.

## 2023-01-16 NOTE — Plan of Care (Signed)
  Problem: Activity: Goal: Risk for activity intolerance will decrease Outcome: Progressing   Problem: Safety: Goal: Ability to remain free from injury will improve Outcome: Progressing   

## 2023-01-17 ENCOUNTER — Encounter (HOSPITAL_COMMUNITY): Payer: Self-pay | Admitting: Cardiovascular Disease

## 2023-01-17 DIAGNOSIS — E119 Type 2 diabetes mellitus without complications: Secondary | ICD-10-CM | POA: Diagnosis not present

## 2023-01-17 DIAGNOSIS — I249 Acute ischemic heart disease, unspecified: Secondary | ICD-10-CM | POA: Diagnosis not present

## 2023-01-17 DIAGNOSIS — I2510 Atherosclerotic heart disease of native coronary artery without angina pectoris: Secondary | ICD-10-CM | POA: Diagnosis not present

## 2023-01-17 DIAGNOSIS — I10 Essential (primary) hypertension: Secondary | ICD-10-CM | POA: Diagnosis not present

## 2023-01-17 LAB — BASIC METABOLIC PANEL
Anion gap: 7 (ref 5–15)
BUN: 17 mg/dL (ref 6–20)
CO2: 27 mmol/L (ref 22–32)
Calcium: 9.5 mg/dL (ref 8.9–10.3)
Chloride: 104 mmol/L (ref 98–111)
Creatinine, Ser: 1.07 mg/dL — ABNORMAL HIGH (ref 0.44–1.00)
GFR, Estimated: 60 mL/min — ABNORMAL LOW (ref >60)
Glucose, Bld: 157 mg/dL — ABNORMAL HIGH (ref 70–99)
Potassium: 3.9 mmol/L (ref 3.5–5.1)
Sodium: 138 mmol/L (ref 135–145)

## 2023-01-17 LAB — LIPOPROTEIN A (LPA): Lipoprotein (a): 371.8 nmol/L — ABNORMAL HIGH (ref <75.0)

## 2023-01-17 LAB — CBC
HCT: 41.5 % (ref 36.0–46.0)
Hemoglobin: 13.9 g/dL (ref 12.0–15.0)
MCH: 29.3 pg (ref 26.0–34.0)
MCHC: 33.5 g/dL (ref 30.0–36.0)
MCV: 87.4 fL (ref 80.0–100.0)
Platelets: 272 10*3/uL (ref 150–400)
RBC: 4.75 MIL/uL (ref 3.87–5.11)
RDW: 13.4 % (ref 11.5–15.5)
WBC: 8.7 10*3/uL (ref 4.0–10.5)
nRBC: 0 % (ref 0.0–0.2)

## 2023-01-17 LAB — GLUCOSE, CAPILLARY: Glucose-Capillary: 122 mg/dL — ABNORMAL HIGH (ref 70–99)

## 2023-01-17 MED ORDER — RANOLAZINE ER 500 MG PO TB12: 500.0000 mg | ORAL_TABLET | Freq: Two times a day (BID) | ORAL | 1 refills | Status: AC

## 2023-01-17 MED ORDER — CLOPIDOGREL BISULFATE 75 MG PO TABS: 75.0000 mg | ORAL_TABLET | Freq: Every day | ORAL | 3 refills | Status: AC

## 2023-01-17 NOTE — Progress Notes (Signed)
Discharge instructions provided to patient. All discharge instructions, follow up appointments, and medications reviewed. IV out. Monitor off CCMD notified. Discharging home.

## 2023-01-17 NOTE — Progress Notes (Signed)
CARDIAC REHAB PHASE I   PRE:  Rate/Rhythm: 76 SR    BP: sitting 144/80    SpO2:   MODE:  Ambulation: 770 ft   POST:  Rate/Rhythm: 80 SR    BP: sitting 173/91     SpO2:   Pt ambulated slow pace (sock feet). Began having SOB and chest warmness/tightness after 550 ft.  Brief rest but finished walking. C/o HA by end of walk. Sx resolved with rest. Pt sts this is her normal sx but gets worse when she is walking faster. Pt took her Ranexa 20 min before ambulation in hall. BP significantly elevated.   Discussed with pt and s/o increasing exercise as tolerated, NTG, and CRPII. Pt receptive. She struggles to exercise on days she works, which is 12 hours 6 days a week. She then has a week off. Encouraged pt to really focus on her days off as well as try to walk halls at work more. She agrees. Will refer to G'sO CRPII however she will need a schedule change in order to do program. She feels confident in her diet and A1C is controlled.  [id number]  Ethelda Chick BS, ACSM-CEP 01/17/2023 9:13 AM

## 2023-01-17 NOTE — Discharge Summary (Signed)
Physician Discharge Summary  Patient ID: Cynthia Hale MRN: [id number] DOB/AGE: September 12, 1965 57 y.o.  Admit date: 01/15/2023 Discharge date: 01/17/2023  Admission Diagnoses: Acute coronary syndrome HTN HLD Type 2 DM S/P CABG  Discharge Diagnoses:  Principal Problem:   Acute coronary syndrome (HCC) Active problems: Multivessel, native vessel CAD S/P CABG Radial artery graft diffuse disease Patent LIMA and RIMA Patent LCx stent with moderate restenosis Type 2 DM HTN HLD  Discharged Condition: good  Hospital Course: 57 years old female with known h/o CAD, s/p LCx stent and CABG, HTN and type 2 DM has substernal chest pain with and without exertion radiating to neck and jaw area worsening in frequency over 3 months. Her troponin I levels were normal.  She underwent cardiac catheterization showing severe 2 vessel disease of left dominant coronaries. LIMA and RIMA were patent. Sequential free radial graft to diagonal and OM had diffuse narrowing. Isosorbide and Ranolazine were added. Her right groin cath site was stable with mild bruise. Her renal function remained stable. She was discharged home in stable condition with f/u by me in 1 week.   Consults: cardiology  Significant Diagnostic Studies: labs: Near normal CBC and BMET. Normal Lipid panel. Elevated Lipoprotein (a) of 371.8 nmol/L  EKG: NSR, RBBB and inferior wall MI.  Cardiac cath: Multivessel, native vessel disease of left dominant coronaries.  Patent LIMA and RIMA. Moderate to severe diffuse narrowing of  free sequential radial graft to diagonal and OM coronaries. Preserved LV systolic function.  Treatments: cardiac meds: lisinopril (generic), metoprolol, Aspirin, Plavix, Rosuvastatin, Imdur and ranolazine.  Discharge Exam: Blood pressure 127/68, pulse 68, temperature 98.2 F (36.8 C), temperature source Oral, resp. rate 18, height 5\' 3"  (1.6 m), weight 75.6 kg, SpO2 99 %. General appearance: alert, cooperative and  appears stated age. Head: Normocephalic, atraumatic. Eyes: Blue eyes, pink conjunctiva, corneas clear.   Neck: No adenopathy, faint carotid bruit, no JVD, supple, symmetrical, trachea midline and thyroid not enlarged. Resp: Clear to auscultation bilaterally. Cardio: Regular rate and rhythm, S1, S2 normal, II/VI systolic murmur, no click, rub or gallop. GI: Soft, non-tender; bowel sounds normal; no organomegaly. Extremities: No edema, cyanosis or clubbing. Mild bruise around right groin cath-site without discharge or pain. Skin: Warm and dry.  Neurologic: Alert and oriented X 3, normal strength and tone. Normal coordination and slow gait.  Disposition: Discharge disposition: 01-Home or Self Care       Discharge Instructions     Amb Referral to Cardiac Rehabilitation   Complete by: As directed    Diagnosis: Stable Angina   After initial evaluation and assessments completed: Virtual Based Care may be provided alone or in conjunction with Phase 2 Cardiac Rehab based on patient barriers.: Yes   Intensive Cardiac Rehabilitation (ICR) [facility] location only OR Traditional Cardiac Rehabilitation (TCR) *If criteria for ICR are not met will enroll in TCR [facility] only): Yes      Allergies as of 01/17/2023       Reactions   Darvon [propoxyphene] Nausea And Vomiting, Other (See Comments)   Darvocet- "years ago"- "Made me VERY sick."   Tape Rash, Other (See Comments)   "Tapes and Band-Aids blister my skin"        Medication List     TAKE these medications    acetaminophen 325 MG tablet Commonly known as: TYLENOL Take 325-650 mg by mouth every 6 (six) hours as needed for mild pain (or headaches).   aspirin 81 MG chewable tablet Chew 81 mg by mouth daily.  clopidogrel 75 MG tablet Commonly known as: PLAVIX Take 1 tablet (75 mg total) by mouth daily with breakfast. Start taking on: January 18, 2023   empagliflozin 25 MG Tabs tablet Commonly known as: JARDIANCE Take 25 mg by mouth  daily.   esomeprazole 40 MG capsule Commonly known as: NEXIUM Take 40 mg by mouth daily.   gabapentin 100 MG capsule Commonly known as: NEURONTIN Take 100 mg by mouth at bedtime as needed (Sleep).   isosorbide mononitrate 30 MG 24 hr tablet Commonly known as: IMDUR Take 30 mg by mouth daily.   lisinopril 10 MG tablet Commonly known as: ZESTRIL Take 10 mg by mouth daily.   Metoprolol Succinate 25 MG Cs24 Take 25 mg by mouth daily.   nitroGLYCERIN 0.4 MG SL tablet Commonly known as: NITROSTAT Place 0.4 mg under the tongue every 5 (five) minutes as needed for chest pain.   ranolazine 500 MG 12 hr tablet Commonly known as: RANEXA Take 1 tablet (500 mg total) by mouth 2 (two) times daily.   rosuvastatin 40 MG tablet Commonly known as: CRESTOR Take 40 mg by mouth daily.   Semaglutide (2 MG/DOSE) 8 MG/3ML Sopn Inject 2 mg into the skin once a week.   Evaristo Bury FlexTouch 100 UNIT/ML FlexTouch Pen Generic drug: insulin degludec Inject 34 Units into the skin daily.   WOMENS MULTIVITAMIN PO Take 1 tablet by mouth daily.        Follow-up Information     Heron Nay, PA Follow up.   Specialty: Physician Assistant Contact information: [address] [place] [address] [phone]         [name], MD Follow up in 1 week(s).   Specialty: Cardiology Contact information: [address] [place] [address] [phone]                 Time spent: Review of old chart, current chart, lab, x-ray, cardiac tests and discussion with patient over 60 minutes.  Signed: Ricki Rodriguez 01/17/2023, 11:17 AM

## 2023-01-17 NOTE — TOC Transition Note (Signed)
Transition of Care [facility]) - CM/SW Discharge Note   Patient Details  Name: Cynthia Hale MRN: [id number] Date of Birth: 05-07-1966  Transition of Care [facility]) CM/SW Contact:  Leone Haven, RN Phone Number: 01/17/2023, 11:16 AM   Clinical Narrative:    She is for dc today, cardiac rehab worked with her and they will set her up for outpt cardiac rehab.  She has transportation.     Final next level of care: Home/Self Care Barriers to Discharge: Continued Medical Work up   Patient Goals and CMS Choice   Choice offered to / list presented to : NA  Discharge Placement                         Discharge Plan and Services Additional resources added to the After Visit Summary for   In-house Referral: NA Discharge Planning Services: CM Consult Post Acute Care Choice: NA          DME Arranged: N/A DME Agency: NA       HH Arranged: NA          Social Determinants of Health (SDOH) Interventions SDOH Screenings   Food Insecurity: No Food Insecurity (01/15/2023)  Housing: Low Risk  (01/15/2023)  Transportation Needs: No Transportation Needs (01/15/2023)  Utilities: Not At Risk (01/15/2023)  Tobacco Use: Low Risk  (01/17/2023)     Readmission Risk Interventions    06/20/2020   12:49 PM  Readmission Risk Prevention Plan  Post Dischage Appt Complete  Medication Screening Complete  Transportation Screening Complete

## 2023-06-03 NOTE — Progress Notes (Signed)
 Subjective   Patient ID:  Cynthia Hale is a 57 y.o. (DOB 1965/08/19) female.     Patient presents with  . Diabetes    Pt presents today for chronic follow up on DM, HTN, lipids. Pt is fasting this am.      HPI:  Patient presents for med follow up for chronic problems including diabetes, HTN, and hyperlipidemia.  Denies any open sores or wounds.  No blurry vision or vision changes. Denies any polyuria, polydipsia, polyphagia. No numbness or tingling of the feet or legs. No change in exercise tolerance. Doing nightly foot exams/recieving regular foot care. Denies chest pain or SOB. No headache.  No dizziness. No lower extremity edema. No myalgias. No problems or side effects with current meds. All medications updated per med list today.   Last labs reviewed with cholesterol and A1C results below.  Home glucose (wears CGM) has been at goal per patients report. She brings FMLA forms for work. + constipation for the past few months, has been taking daily stool softeners with minimal benefit.   Wt Readings from Last 3 Encounters:  06/03/23 157 lb 9.6 oz (71.5 kg)  04/28/23 158 lb (71.7 kg)  01/30/23 160 lb 9.6 oz (72.8 kg)   Lab Results  Component Value Date   Hemoglobin A1c 5.9 (A) 12/11/2022   Hemoglobin A1c 6.8 (A) 09/12/2022   Hemoglobin A1c 9.1 (A) 06/06/2022   Lab Results  Component Value Date   LDL 72 06/26/2022   Creatinine 0.88 06/26/2022   Outpatient Medications Marked as Taking for the 06/03/23 encounter (Office Visit) with Tinnie FORBES Salt, PA  Medication Sig Dispense Refill  . acetaminophen  (TYLENOL ) 325 mg tablet Take one tablet to two tablets (325-650 mg dose) by mouth.    . aspirin  (ASPRI-LOW,ECOTRIN LOW DOSE) 81 mg EC tablet Take one tablet (81 mg dose) by mouth daily.    . clopidogrel  bisulfate (PLAVIX ) 75 mg tablet Take one tablet (75 mg dose) by mouth 1 (one) time each day with breakfast.    . Continuous Blood Gluc Receiver (FREESTYLE LIBRE 2 READER SYSTM) DEVI 1  Device by Does not apply route every 14 (fourteen) days. 6 each 4  . Continuous Blood Gluc Sensor (FREESTYLE LIBRE 2 SENSOR SYSTM) MISC Inject 1 Device into the skin every 14 (fourteen) days. 6 each 3  . esomeprazole magnesium  (NEXIUM) 40 mg capsule Take one capsule (40 mg dose) by mouth daily.    . Insulin  Pen Needle (B-D UF III MINI PEN NEEDLES) 31G X 5 MM MISC Use to inject insulin  daily 90 each 1  . isosorbide  mononitrate (IMDUR) 30 mg 24 hr tablet Take one tablet (30 mg dose) by mouth daily.    SABRA JARDIANCE 25 MG TABS tablet TAKE 1 TABLET DAILY 90 tablet 3  . lisinopril  (PRINIVIL ,ZESTRIL ) 10 mg tablet TAKE 1 TABLET DAILY 90 tablet 3  . metoprolol  succinate (TOPROL -XL) 25 mg 24 hr tablet TAKE 1 TABLET DAILY 90 tablet 3  . Misc. Devices MISC Salicylic acid 4.3% and Undecylenic acid. Dispense 60 mL. Please apply to affected nails daily as directed 1 each 1  . nitroGLYCERIN  (NITROSTAT ) 0.4 mg SL tablet Place one tablet (0.4 mg dose) under the tongue 3 (three) times a day.    . ranolazine  (RANEXA ) 500 mg 12 hr tablet Take one tablet (500 mg dose) by mouth.    . rosuvastatin  calcium  (CRESTOR ) 40 mg tablet TAKE 1 TABLET DAILY 90 tablet 3  . Semaglutide, 2 MG/DOSE, 8 MG/3ML SOPN  pen Inject 2 mg into the skin once a week. 9 mL 2  . TRESIBA FLEXTOUCH 100 UNIT/ML injection INJECT 44 UNITS UNDER THE SKIN EVERY NIGHT (Patient taking differently: thirty four Units daily.) 15 mL 9     Past Medical History:  Diagnosis Date  . Abnormal uterine bleeding   . Diabetes mellitus (*)   . Hypertension     Allergies  Allergen Reactions  . Animal Dander Other    Eye irritation  . Other Other and Rash    Tapes and Band-Aids blister my skin  . Propoxyphene And Methadone Nausea And Vomiting and Other    Darvocet- years ago- Made me VERY sick.    Social History   Socioeconomic History  . Marital status: Divorced  Tobacco Use  . Smoking status: Former    Current packs/day: 0.00    Average  packs/day: 0.3 packs/day for 24.0 years (6.0 ttl pk-yrs)    Types: Cigarettes    Start date: 66    Quit date: 2018    Years since quitting: 6.8    Passive exposure: Never  . Smokeless tobacco: Never  Vaping Use  . Vaping status: Never Used  Substance and Sexual Activity  . Alcohol use: Yes    Comment: Rarely  . Drug use: No  . Sexual activity: Never    Partners: Male    Birth control/protection: None    Review of systems is reviewed and complete and all others are negative except as noted.    Objective   BP 118/80   Pulse 74   Temp 98 F (36.7 C)   Resp 18   Ht 5' 3 (1.6 m)   Wt 157 lb 9.6 oz (71.5 kg)   LMP 07/19/2015   SpO2 97%   Breastfeeding No   BMI 27.92 kg/m  General:  Well Developed, Well Nourished, Non-distressed. Pleasant and Interactive. Neck:  Supple with normal range of motion, No lymphadenopathy, JVD, bruits, or thyromegaly Cardiovascular:  Heart with regular, rate, and rhythm without murmurs, gallops, or rubs Lungs:  Clear to auscultation anterior and posterior without wheezing, rhonchi, or rales  Abdomen:  Bowel sounds active, soft, non-tender, non-distended, no hepatosplenomegaly or masses palpable.  No bruits auscultated. Skin:  Warm, dry.  No rash.  No open sores or wounds visible. Extremities:  No clubbing, cyanosis, or edema.  Dorsalis pedis and posterior tibial pulses present and equal. Neurologic:  No focal deficits, Cranial Nerves II-XII intact  Diabetic foot exam:  Left: Monofilament test: Sensation abnormal  Pulses: present  Skin: Normal and no erythema, no cyanosis or pallor  Right: Monofilament test: Sensation abnormal  Pulses: present  Skin: Normal and no erythema, no cyanosis or pallor  Exam performed with shoes and socks removed.    Impression / Plan   1. Essential hypertension  Comprehensive Metabolic Panel   CBC And Differential   TSH   TSH   CBC And Differential   Comprehensive Metabolic Panel    2. Mixed hyperlipidemia   Lipid Panel With LDL/HDL Ratio   Lipid Panel With LDL/HDL Ratio    3. Controlled type 2 diabetes mellitus without complication, with long-term current use of insulin  (*)  Hemoglobin A1c   Albumin /Creatinine Ratio, Random Urine   Albumin /Creatinine Ratio, Random Urine   Hemoglobin A1c    4. Vitamin D deficiency  Vitamin D 25 Hydroxy   Vitamin D 25 Hydroxy   Chronic: not taking Rx    5. History of tobacco use  CT Chest Lung  Screening    6. Chronic idiopathic constipation  linaclotide (LINZESS) 145 mcg capsule        Continue with medications as discussed today.  Continue to advise pt to follow diabetic diet watching portion sizes as well.  Regular exercise advised.  Patient instructions and education given as below.  Advised to take medications as described below.  Pt agreeable to trial of Linzess for constipation. [employer] forms completed, scanned, and faxed Labs ordered today and I will follow up with the patient with any additional recommendations once the results are available.    Advised patient to keep regular med follow up as below and recommend wellness exam once a year. Annual Diabetic Eye Exam Recommended. Reviewed need to been seen here in the office for regular diabetic screenings. A yearly preventative health exam was recommended and current age based recommendations were discussed.  The patient/family voices understanding of all medications. Plan of care is reviewed with the patient/family and barriers to adherence were noted and addressed, or no barriers were noted. Patient agrees to continue taking all medications as prescribed and is currently tolerating them well. For any new medications, the patient/family was instructed of directions for use and the consequences of not taking medication and they were informed about the potential side effects and drug interactions. AVS was given to the patient. Risks, benefits, and alternatives of the medications and treatment plan  prescribed today were discussed, and patient expressed understanding. Plan follow-up as discussed or as needed if any worsening symptoms or change in condition.    Follow up in about 4 months (around 10/01/2023) for Chronic Fasting OV.  Future Appointments  Date Time Provider Department Center  10/02/2023  9:30 AM Tinnie FORBES Salt, PA [facility] None     Patient's Medications       * Accurate as of June 03, 2023 11:50 AM. Reflects encounter med changes as of last refresh          New Prescriptions      Instructions  linaclotide 145 mcg capsule Commonly known as: LINZESS Started by: Tinnie Salt  145 mcg, Oral, Daily       Continued Medications      Instructions  acetaminophen  325 mg tablet Commonly known as: TYLENOL   325-650 mg, Oral   aspirin  81 mg EC tablet Commonly known as: ASPRI-LOW,ECOTRIN LOW DOSE  81 mg, Oral, Daily   clopidogrel  bisulfate 75 mg tablet Commonly known as: PLAVIX   75 mg, Oral, Daily with breakfast   esomeprazole magnesium  40 mg capsule Commonly known as: NEXIUM  40 mg, Oral, Daily   FREESTYLE LIBRE 2 READER SYSTM Devi  1 Device, Does not apply, Every 14 days   FREESTYLE LIBRE 2 SENSOR SYSTM Misc  1 Device, Subcutaneous, Every 14 days   Insulin  Pen Needle 31G X 5 MM Misc Commonly known as: BD PEN NEEDLE MINI U/F  Use to inject insulin  daily   isosorbide  mononitrate 30 mg 24 hr tablet Commonly known as: IMDUR  30 mg, Oral, Daily   JARDIANCE 25 MG Tabs tablet Generic drug: empagliflozin  25 mg, Oral, Daily   lisinopril  10 mg tablet Commonly known as: PRINIVIL ,ZESTRIL   10 mg, Oral, Daily   metoprolol  succinate 25 mg 24 hr tablet Commonly known as: TOPROL -XL  25 mg, Oral, Daily   Misc. Devices Misc  Salicylic acid 4.3% and Undecylenic acid. Dispense 60 mL. Please apply to affected nails daily as directed   nitroGLYCERIN  0.4 mg SL tablet Commonly known as: NITROSTAT   0.4  mg, Sublingual, 3 times a day   ranolazine  500 mg  12 hr tablet Commonly known as: RANEXA   500 mg, Oral   rosuvastatin  calcium  40 mg tablet Commonly known as: CRESTOR   40 mg, Oral, Daily   Semaglutide (2 MG/DOSE) 8 MG/3ML Sopn pen  2 mg, Subcutaneous, Weekly       Modified Medications      Instructions  TRESIBA FLEXTOUCH 100 UNIT/ML injection Generic drug: Insulin  Degludec What changed: See the new instructions.  INJECT 44 UNITS UNDER THE SKIN EVERY NIGHT        Orders Placed This Encounter  Procedures  . CT Chest Lung Screening  . Pneumococcal conjugate vaccine 20-valent  . Comprehensive Metabolic Panel  . CBC And Differential  . Lipid Panel With LDL/HDL Ratio  . Hemoglobin A1c  . TSH  . Albumin /Creatinine Ratio, Random Urine  . Vitamin D 25 Hydroxy   Medication list was reviewed, updated, and a copy provided to patient upon leaving. Handout given to the patient including the following instructions: There are no Patient Instructions on file for this visit.  Low Dose CT for Lung Cancer Screening Documentation   Patient eligibility for performance of this evaluation is documented in the table below.  Shared decision-making was completed with Rosaline on 06/03/2023 during a face-to-face visit and included discussion of benefits and harms of testing.  Neosha was counseled on the following items - importance of adherence to annual lung cancer screening, impact of co-morbidities, and ability and/or willingness to undergo diagnosis and treatment.   Does patient meet the following requirements? (Note: must meet all to qualify for screening)   Yes/No  51-49 years of age Yes  Asymptomatic (no signs or symptoms of lung cancer) Yes  Tobacco smoking history of at least 1 ppd for 20 years Yes  Current smoker or has quit within last 15 years Yes     *Some images could not be shown.

## 2023-09-26 NOTE — Progress Notes (Signed)
 Spoke with Patient today to discuss appointments and health maintenance screenings due for the upcoming year.     Appointments which have been scheduled    Oct 02, 2023 9:30 AM Office Visit with Tinnie FORBES Salt, PA [facility] (--) [address] [place] [address] [phone]        Health maintenance topics discussed and/or scheduled: Mammogram  Visits scheduled: Office Visit  Comments: sending office a message

## 2023-10-15 NOTE — Progress Notes (Signed)
 Subjective   Patient ID: Cynthia Hale is a 58 y.o. (DOB 1966-02-26) female  History of Present Illness: This is a 58 y.o. female who returns for follow-up of type 2 diabetes. As noted in my initial consult, she was diagnosed with diabetes in 2017. At this visit we switched from Levemir to Tresiba and Rybelsus to Ozempic.    A1c 5.9% today. Last A1c 5.9% on 12/11/22, 6.8% on 09/12/22, 9.1% on 06/06/22, 10.0% on 02/19/22, 7.0% on 10/17/20, 8.0% on 07/19/20, 7.9% on 10/21/18, 8.6% on 08/03/16, 9.0% on 04/22/16, 12.9% on 01/17/16.    Patient confirms taking diabetes medications as follows:  Tresiba 34 units qHS  Jardiance 25mg  #1 tablet QD  Ozempic 2mg  weekly on Fridays         Weight trend:  Body mass index is 27.81 kg/m.  Wt Readings from Last 3 Encounters:  10/15/23 157 lb (71.2 kg)  09/24/23 155 lb 12.8 oz (70.7 kg)  06/03/23 157 lb 9.6 oz (71.5 kg)    Retinopathy: No historical documentation   Neuropathy: Peripheral Nephropathy: No historical documentation    Cardiovascular disease: No historical documentation  Cerebrovascular disease: No historical documentation  Peripheral vascular disease: No historical documentation    Most recent retinal exam:   01/26/2016-DUE                       Most recent LDL cholesterol:  86mg /dL (88/87/75)               Taking statin?:    Rosuvastatin  40mg    Most recent urine microalbumin/Cr ratio: 7mg /g (06/03/23)               Taking ACEI/ARB?:   Lisinopril  10mg   Taking daily ASA?:   ASA81mg   Current smoker?:    No  Discussed smoking cessation?:  N/A  Patient Active Problem List  Diagnosis  . Controlled type 2 diabetes mellitus without complication, with long-term current use of insulin   (*)  . Essential hypertension  . Elevated alkaline phosphatase level  . Vitamin D deficiency  . Lymphocytosis  . Leukocytosis  . Calcification of right breast  . Nail problem  . Mixed hyperlipidemia  . Diabetic neuropathy with neurologic  complication  (*)  . Former smoker  . S/P angioplasty with stent  . Stable angina  . Chronic idiopathic constipation    Past Medical History:  Diagnosis Date  . Abnormal uterine bleeding   . Diabetes mellitus  (*)   . Hypertension     Past Surgical History:  Procedure Laterality Date  . Cardiac catheterization     July 2024  . Cardiac surgery  09/11/2018   Stent Placement  . Cesarean section    . Cholecystectomy    . Coronary artery bypass graft    . Wrist surgery Bilateral    1981 1982    Current Outpatient Medications  Medication Instructions  . acetaminophen  (TYLENOL ) 325-650 mg  . aspirin  (ASPRI-LOW,ECOTRIN LOW DOSE) 81 mg, Daily  . clopidogrel  bisulfate (PLAVIX ) 75 mg, Daily with breakfast  . Continuous Glucose Sensor (FREESTYLE LIBRE 3 PLUS SENSOR) MISC 1 each, Does not apply, See admin instructions  . esomeprazole magnesium  (NEXIUM) 40 mg, Daily  . Insulin  Pen Needle (B-D UF III MINI PEN NEEDLES) 31G X 5 MM MISC Use to inject insulin  daily  . isosorbide  mononitrate (IMDUR) 30 mg, Daily  . JARDIANCE 25 mg, Oral, Daily  . linaclotide (LINZESS) 145 mcg, Oral, Daily  . lisinopril  (PRINIVIL ,ZESTRIL ) 10 mg,  Oral, Daily  . metoprolol  succinate (TOPROL -XL) 25 mg, Oral, Daily  . Misc. Devices MISC Salicylic acid 4.3% and Undecylenic acid. Dispense 60 mL. Please apply to affected nails daily as directed  . nitroGLYCERIN  (NITROSTAT ) 0.4 mg, 3 times a day  . ranolazine  (RANEXA ) 500 mg  . rosuvastatin  calcium  (CRESTOR ) 40 mg, Oral, Daily  . Semaglutide (2 MG/DOSE) 2 mg, Subcutaneous, Weekly  . TRESIBA FLEXTOUCH 100 UNIT/ML injection INJECT 44 UNITS UNDER THE SKIN EVERY NIGHT     Reviewed and updated this visit by provider: Tobacco  Allergies  Meds  Problems  Med Hx  Surg Hx  Fam Hx      ROS Eight systems were reviewed, and pertinent positives and negatives are as discussed in HPI.  Objective   Vitals:   10/15/23 1138  BP: 112/62  Weight: 157 lb (71.2 kg)     Physical Exam Vitals reviewed.  Cardiovascular:     Rate and Rhythm: Normal rate and regular rhythm.  Pulmonary:     Effort: Pulmonary effort is normal.     Breath sounds: Normal breath sounds.  Skin:    General: Skin is warm and dry.  Neurological:     General: No focal deficit present.     Mental Status: She is alert and oriented to person, place, and time. Mental status is at baseline.  Psychiatric:        Mood and Affect: Mood normal.        Behavior: Behavior normal.        Thought Content: Thought content normal.        Judgment: Judgment normal.         Laboratory data Lab Results  Component Value Date   Hemoglobin A1c 5.9 (A) 10/15/2023   Hemoglobin A1c 6.0 (H) 06/03/2023   Hemoglobin A1c 5.9 (A) 12/11/2022   Results for orders placed or performed in visit on 06/03/23  Albumin /Creatinine Ratio, Random Urine  Result Value Ref Range   Creatinine, Urine 84.7 Not Estab. mg/dL   Albumin , Urine 6.1 Not Estab. ug/mL   Alb/Creat Ratio 7 0 - 29 mg/g creat   Narrative   Performed at:  [address] [employer] [address] [place], [place]  [id number] Lab Director: Frankey Sas MD, Phone:  [phone]   Lab Results  Component Value Date/Time   Glucose 145 (H) 09/24/2023 03:55 PM   BUN 21 09/24/2023 03:55 PM   Creatinine 0.96 09/24/2023 03:55 PM   eGFR 68 09/24/2023 03:55 PM   Sodium 139 09/24/2023 03:55 PM   Potassium 4.1 09/24/2023 03:55 PM   Chloride 100 09/24/2023 03:55 PM   CO2 23 09/24/2023 03:55 PM   CALCIUM  9.7 09/24/2023 03:55 PM   Total Protein 7.6 06/03/2023 10:11 AM   Albumin , Serum 4.7 06/03/2023 10:11 AM   Globulin, Total 2.9 06/03/2023 10:11 AM   Albumin /Globulin Ratio 1.6 06/26/2022 10:54 AM   Total Bilirubin 0.9 06/03/2023 10:11 AM   Alkaline Phosphatase 93 06/03/2023 10:11 AM   AST 25 06/03/2023 10:11 AM   ALT (SGPT) 29 06/03/2023 10:11 AM    Lab Results  Component Value Date/Time   Cholesterol, Total 164 06/03/2023 10:11 AM    Triglycerides 156 (H) 06/03/2023 10:11 AM   HDL 51 06/03/2023 10:11 AM   VLDL Cholesterol Cal 27 06/03/2023 10:11 AM   LDL 86 06/03/2023 10:11 AM    Assessment and Plan   1. Controlled type 2 diabetes mellitus without complication, with long-term current use of insulin   (*) (Primary) -  POCT Hemoglobin A1C -     Semaglutide, 2 MG/DOSE, 8 MG/3ML SOPN pen; Inject 2 mg into the skin once a week., Starting Wed 10/15/2023, Normal -     Continuous Glucose Sensor (FREESTYLE LIBRE 3 PLUS SENSOR) MISC; 1 each by Does not apply route see administration instructions., Starting Wed 10/15/2023, Normal  Discussion & Recommendations A1c stable. Patient would like to upgrade to the FL3+. She has been without Ozempic 2mg  for a month due to running out of medication. I have provided her two samples of the 0.25-0.5mg  pens for her to complete 2 doses of 0.5mg  and 2 doses of 1mg  and then getting back on the full 2mg  dose.   Patient Instructions A1c 5.9%  Continue Tresiba 34 units every night  Continue Jardiance 25mg  #1 tablet  Continue Ozempic 2mg  weekly on Fridays  Continue to use your [place] to monitor glucose levels  Libre 3 plus sensors sent to [facility]  Schedule your yearly diabetes eye exam  Return to clinic in 3 months       Follow up in about 3 months (around 01/15/2024) for T2D [facility]); 20 min.   Documentation for time-based billing:  Total time spent of date of service was 30 minutes.  Patient care activities included preparing to see the patient such as reviewing the patient record, obtaining and/or reviewing separately obtained history, performing a medically appropriate history and physical examination, counseling and educating the patient, family, and/or caregiver, ordering prescription medications, tests, or procedures, referring and communicating with other health care providers when not separately reported during the visit, documenting clinical information in the electronic or other  health record, independently interpreting results when not separately reported, communicating results to the patient/family/caregiver, and coordinating the care of the patient when not separately reported.      Delon CINDERELLA Nigh, NP 10/15/2023, 12:16 PM

## 2023-10-28 LAB — COLOGUARD: COLOGUARD: NEGATIVE

## 2024-06-23 NOTE — Progress Notes (Signed)
 Subjective   Patient ID: Cynthia Hale is a 58 y.o. (DOB [birth year]) female  History of Present Illness: This is a 58 y.o. female who returns for follow-up of type 2 diabetes.  As noted in my initial consult, she was diagnosed with diabetes in 2017. At this visit we switched from Levemir to Tresiba and Rybelsus to Ozempic.    A1c 5.9% on 06/23/24, 5.9% on 12/11/22, 6.8% on 09/12/22, 9.1% on 06/06/22, 10.0% on 02/19/22, 7.0% on 10/17/20, 8.0% on 07/19/20, 7.9% on 10/21/18, 8.6% on 08/03/16, 9.0% on 04/22/16, 12.9% on 01/17/16.    Patient confirms taking diabetes medications as follows:  Tresiba 34 units qHS  Ozempic 2mg  weekly on Fridays  Jardiance 25mg  #1 tablet QD         Weight trend:  Body mass index is 27.28 kg/m.  Wt Readings from Last 3 Encounters:  06/23/24 154 lb (69.9 kg)  06/22/24 154 lb (69.9 kg)  10/15/23 157 lb (71.2 kg)    Retinopathy: No historical documentation   Neuropathy: Peripheral Nephropathy: No historical documentation    Cardiovascular disease: No historical documentation  Cerebrovascular disease: No historical documentation  Peripheral vascular disease: No historical documentation    Most recent retinal exam:                         05/21/24                   Most recent LDL cholesterol:                    86mg /dL (88/87/75)               Taking statin?:                              Rosuvastatin  40mg    Most recent urine microalbumin/Cr ratio: 7mg /g (06/03/23)               Taking ACEI/ARB?:                     Lisinopril  10mg   Taking daily ASA?:                                   ASA 81mg   Current smoker?:                                       No  Discussed smoking cessation?:               N/A   Reviewed and updated this visit by provider: Tobacco  Allergies  Meds  Problems  Med Hx  Surg Hx  Fam Hx      ROS Eight systems were reviewed, and pertinent positives and negatives are as discussed in HPI.  Objective   Vitals:    06/23/24 1151  BP: 112/64  Height: 5' 3 (1.6 m)  Weight: 154 lb (69.9 kg)  BMI (Calculated): 27.3    Physical Exam Vitals reviewed.  Cardiovascular:     Rate and Rhythm: Normal rate and regular rhythm.  Pulmonary:     Effort: Pulmonary effort is normal.     Breath sounds: Normal breath sounds.  Skin:    General: Skin is warm and dry.  Neurological:     General: No focal deficit present.     Mental Status: She is alert and oriented to person, place, and time. Mental status is at baseline.  Psychiatric:        Mood and Affect: Mood normal.        Behavior: Behavior normal.        Thought Content: Thought content normal.        Judgment: Judgment normal.       Laboratory data  Lab Results  Component Value Date   Hemoglobin A1c 5.9 (A) 06/23/2024   Hemoglobin A1c 6.7 (H) 06/22/2024   Hemoglobin A1c 5.9 (A) 10/15/2023   Results for orders placed or performed in visit on 06/03/23  Albumin /Creatinine Ratio, Random Urine  Result Value Ref Range   Creatinine, Urine 84.7 Not Estab. mg/dL   Albumin , Urine 6.1 Not Estab. ug/mL   Alb/Creat Ratio 7 0 - 29 mg/g creat   Narrative   Performed at:  [address] [facility] [address], [place], [place]  [id number] Lab Director: Frankey Sas MD, Phone:  [phone]   Lab Results  Component Value Date/Time   Glucose 111 (H) 06/22/2024 02:36 PM   BUN 16 06/22/2024 02:36 PM   Creatinine 1.14 (H) 06/22/2024 02:36 PM   eGFR 55 (L) 06/22/2024 02:36 PM   Sodium 137 06/22/2024 02:36 PM   Potassium 4.8 06/22/2024 02:36 PM   Chloride 99 06/22/2024 02:36 PM   CO2 22 06/22/2024 02:36 PM   CALCIUM  10.5 (H) 06/22/2024 02:36 PM   Total Protein 7.2 06/22/2024 02:36 PM   Albumin , Serum 4.6 06/22/2024 02:36 PM   Globulin, Total 2.6 06/22/2024 02:36 PM   Albumin /Globulin Ratio 1.6 06/26/2022 10:54 AM   Total Bilirubin 0.8 06/22/2024 02:36 PM   Alkaline Phosphatase 90 06/22/2024 02:36 PM   AST 26 06/22/2024 02:36 PM   ALT (SGPT) 27  06/22/2024 02:36 PM    Lab Results  Component Value Date/Time   Cholesterol, Total 207 (H) 06/22/2024 02:36 PM   Triglycerides 246 (H) 06/22/2024 02:36 PM   HDL 51 06/22/2024 02:36 PM   VLDL Cholesterol Cal 43 (H) 06/22/2024 02:36 PM   LDL 113 (H) 06/22/2024 02:36 PM    Lab Results  Component Value Date/Time   TSH 3.390 06/22/2024 02:36 PM    Assessment and Plan   1. Controlled type 2 diabetes mellitus without complication, with long-term current use of insulin  (*) (Primary) -     POCT Hemoglobin A1C -     TRESIBA FLEXTOUCH 100 UNIT/ML injection; Inject thirty four Units into the skin daily. MDD 50 UNITS., Starting Wed 06/23/2024, Normal 2. Uses self-applied continuous glucose monitoring device  Discussion & Recommendations A1c stable. Patient to continue current regimen.   Patient Instructions A1c 5.9% on 06/23/24 Continue Tresiba 34 units every night  Continue Ozempic 2mg  weekly on Fridays  Continue Jardiance 25mg  #1 tablet daily  Continue using your Herlene to monitor glucose levels Return to clinic in 3 months      Follow up in about 3 months (around 09/21/2024) for T2D [place]is); 20 min.   Documentation for time-based billing:  Total time spent of date of service was 30 minutes.  Patient care activities included preparing to see the patient such as reviewing the patient record, obtaining and/or reviewing separately obtained history, performing a medically appropriate history and physical examination, counseling and educating the patient, family, and/or caregiver, ordering prescription medications, tests, or procedures, referring and communicating with other health care providers when  not separately reported during the visit, documenting clinical information in the electronic or other health record, independently interpreting results when not separately reported, communicating results to the patient/family/caregiver, and coordinating the care of the patient when not separately  reported.     Delon CINDERELLA Nigh, NP 06/24/2024, 9:58 AM
